# Patient Record
Sex: Female | Born: 1985 | Race: Black or African American | Hispanic: No | Marital: Single | Smoking: Current every day smoker
Health system: Southern US, Community
[De-identification: ages and names within clinical notes are randomized; demographics above are authoritative.]

## PROBLEM LIST (undated history)

## (undated) DIAGNOSIS — Z789 Other specified health status: Secondary | ICD-10-CM

## (undated) DIAGNOSIS — D649 Anemia, unspecified: Secondary | ICD-10-CM

## (undated) HISTORY — PX: NO PAST SURGERIES: SHX2092

---

## 2008-10-08 ENCOUNTER — Emergency Department (HOSPITAL_BASED_OUTPATIENT_CLINIC_OR_DEPARTMENT_OTHER): Admission: EM | Admit: 2008-10-08 | Discharge: 2008-10-08 | Payer: Self-pay | Admitting: Emergency Medicine

## 2008-11-23 ENCOUNTER — Ambulatory Visit: Payer: Self-pay | Admitting: Radiology

## 2008-11-23 ENCOUNTER — Emergency Department (HOSPITAL_BASED_OUTPATIENT_CLINIC_OR_DEPARTMENT_OTHER): Admission: EM | Admit: 2008-11-23 | Discharge: 2008-11-23 | Payer: Self-pay | Admitting: Emergency Medicine

## 2008-11-30 ENCOUNTER — Emergency Department (HOSPITAL_BASED_OUTPATIENT_CLINIC_OR_DEPARTMENT_OTHER): Admission: EM | Admit: 2008-11-30 | Discharge: 2008-11-30 | Payer: Self-pay | Admitting: Emergency Medicine

## 2009-01-12 ENCOUNTER — Emergency Department (HOSPITAL_BASED_OUTPATIENT_CLINIC_OR_DEPARTMENT_OTHER): Admission: EM | Admit: 2009-01-12 | Discharge: 2009-01-12 | Payer: Self-pay | Admitting: Emergency Medicine

## 2009-03-02 ENCOUNTER — Emergency Department (HOSPITAL_BASED_OUTPATIENT_CLINIC_OR_DEPARTMENT_OTHER): Admission: EM | Admit: 2009-03-02 | Discharge: 2009-03-02 | Payer: Self-pay | Admitting: Emergency Medicine

## 2009-03-02 ENCOUNTER — Ambulatory Visit: Payer: Self-pay | Admitting: Diagnostic Radiology

## 2009-03-09 ENCOUNTER — Ambulatory Visit (HOSPITAL_COMMUNITY): Admission: RE | Admit: 2009-03-09 | Discharge: 2009-03-09 | Payer: Self-pay | Admitting: Obstetrics and Gynecology

## 2009-03-23 ENCOUNTER — Ambulatory Visit: Payer: Self-pay | Admitting: Physician Assistant

## 2009-03-23 ENCOUNTER — Inpatient Hospital Stay (HOSPITAL_COMMUNITY): Admission: AD | Admit: 2009-03-23 | Discharge: 2009-03-24 | Payer: Self-pay | Admitting: Obstetrics and Gynecology

## 2009-04-20 ENCOUNTER — Ambulatory Visit (HOSPITAL_COMMUNITY): Admission: RE | Admit: 2009-04-20 | Discharge: 2009-04-20 | Payer: Self-pay | Admitting: Obstetrics and Gynecology

## 2009-04-29 ENCOUNTER — Emergency Department (HOSPITAL_BASED_OUTPATIENT_CLINIC_OR_DEPARTMENT_OTHER): Admission: EM | Admit: 2009-04-29 | Discharge: 2009-04-29 | Payer: Self-pay | Admitting: Emergency Medicine

## 2009-05-01 ENCOUNTER — Emergency Department (HOSPITAL_BASED_OUTPATIENT_CLINIC_OR_DEPARTMENT_OTHER): Admission: EM | Admit: 2009-05-01 | Discharge: 2009-05-02 | Payer: Self-pay | Admitting: Emergency Medicine

## 2009-05-04 ENCOUNTER — Inpatient Hospital Stay (HOSPITAL_COMMUNITY): Admission: AD | Admit: 2009-05-04 | Discharge: 2009-05-04 | Payer: Self-pay | Admitting: Obstetrics and Gynecology

## 2009-07-10 ENCOUNTER — Inpatient Hospital Stay (HOSPITAL_COMMUNITY): Admission: AD | Admit: 2009-07-10 | Discharge: 2009-07-10 | Payer: Self-pay | Admitting: Obstetrics and Gynecology

## 2009-07-17 ENCOUNTER — Inpatient Hospital Stay (HOSPITAL_COMMUNITY): Admission: AD | Admit: 2009-07-17 | Discharge: 2009-07-17 | Payer: Self-pay | Admitting: Obstetrics and Gynecology

## 2009-07-18 ENCOUNTER — Inpatient Hospital Stay (HOSPITAL_COMMUNITY): Admission: AD | Admit: 2009-07-18 | Discharge: 2009-07-20 | Payer: Self-pay | Admitting: Obstetrics and Gynecology

## 2009-11-03 ENCOUNTER — Emergency Department (HOSPITAL_BASED_OUTPATIENT_CLINIC_OR_DEPARTMENT_OTHER): Admission: EM | Admit: 2009-11-03 | Discharge: 2009-11-03 | Payer: Self-pay | Admitting: Emergency Medicine

## 2009-12-21 ENCOUNTER — Emergency Department (HOSPITAL_BASED_OUTPATIENT_CLINIC_OR_DEPARTMENT_OTHER): Admission: EM | Admit: 2009-12-21 | Discharge: 2009-12-21 | Payer: Self-pay | Admitting: Emergency Medicine

## 2009-12-22 ENCOUNTER — Emergency Department (HOSPITAL_BASED_OUTPATIENT_CLINIC_OR_DEPARTMENT_OTHER): Admission: EM | Admit: 2009-12-22 | Discharge: 2009-12-22 | Payer: Self-pay | Admitting: Emergency Medicine

## 2010-01-26 ENCOUNTER — Emergency Department (HOSPITAL_BASED_OUTPATIENT_CLINIC_OR_DEPARTMENT_OTHER): Admission: EM | Admit: 2010-01-26 | Discharge: 2010-01-26 | Payer: Self-pay | Admitting: Emergency Medicine

## 2010-04-01 ENCOUNTER — Emergency Department (HOSPITAL_BASED_OUTPATIENT_CLINIC_OR_DEPARTMENT_OTHER): Admission: EM | Admit: 2010-04-01 | Discharge: 2010-04-01 | Payer: Self-pay | Admitting: Emergency Medicine

## 2010-04-13 ENCOUNTER — Emergency Department (HOSPITAL_BASED_OUTPATIENT_CLINIC_OR_DEPARTMENT_OTHER): Admission: EM | Admit: 2010-04-13 | Discharge: 2010-04-14 | Payer: Self-pay | Admitting: Emergency Medicine

## 2010-04-13 ENCOUNTER — Ambulatory Visit: Payer: Self-pay | Admitting: Diagnostic Radiology

## 2010-06-13 ENCOUNTER — Ambulatory Visit: Payer: Self-pay | Admitting: Radiology

## 2010-06-13 ENCOUNTER — Emergency Department (HOSPITAL_BASED_OUTPATIENT_CLINIC_OR_DEPARTMENT_OTHER): Admission: EM | Admit: 2010-06-13 | Discharge: 2010-06-13 | Payer: Self-pay | Admitting: Emergency Medicine

## 2010-09-11 ENCOUNTER — Emergency Department (HOSPITAL_BASED_OUTPATIENT_CLINIC_OR_DEPARTMENT_OTHER): Admission: EM | Admit: 2010-09-11 | Discharge: 2010-09-11 | Payer: Self-pay | Admitting: Emergency Medicine

## 2010-09-25 ENCOUNTER — Emergency Department (HOSPITAL_BASED_OUTPATIENT_CLINIC_OR_DEPARTMENT_OTHER): Admission: EM | Admit: 2010-09-25 | Discharge: 2010-09-25 | Payer: Self-pay | Admitting: Emergency Medicine

## 2011-02-01 LAB — URINALYSIS, ROUTINE W REFLEX MICROSCOPIC
Bilirubin Urine: NEGATIVE
Glucose, UA: 100 mg/dL — AB
Ketones, ur: 15 mg/dL — AB
Nitrite: NEGATIVE
Protein, ur: NEGATIVE mg/dL
Specific Gravity, Urine: 1.025 (ref 1.005–1.030)
pH: 6.5 (ref 5.0–8.0)

## 2011-02-01 LAB — CBC
Hemoglobin: 11.1 g/dL — ABNORMAL LOW (ref 12.0–15.0)
MCH: 26.2 pg (ref 26.0–34.0)
MCV: 81.2 fL (ref 78.0–100.0)
RBC: 4.26 MIL/uL (ref 3.87–5.11)

## 2011-02-01 LAB — BASIC METABOLIC PANEL
CO2: 27 mEq/L (ref 19–32)
Creatinine, Ser: 0.6 mg/dL (ref 0.4–1.2)
GFR calc Af Amer: >60 mL/min (ref >60)
Glucose, Bld: 98 mg/dL (ref 70–99)
Sodium: 142 mEq/L (ref 135–145)

## 2011-02-01 LAB — URINE MICROSCOPIC-ADD ON

## 2011-02-01 LAB — DIFFERENTIAL
Basophils Absolute: 0 10*3/uL (ref 0.0–0.1)
Basophils Relative: 0 % (ref 0–1)
Monocytes Absolute: 0.8 10*3/uL (ref 0.1–1.0)
Monocytes Relative: 10 % (ref 3–12)

## 2011-02-01 LAB — URINE CULTURE

## 2011-02-01 LAB — HCG, QUANTITATIVE, PREGNANCY: hCG, Beta Chain, Quant, S: 102947 m[IU]/mL — ABNORMAL HIGH (ref <5)

## 2011-02-04 LAB — URINE MICROSCOPIC-ADD ON

## 2011-02-04 LAB — DIFFERENTIAL
Basophils Absolute: 0.1 10*3/uL (ref 0.0–0.1)
Basophils Relative: 1 % (ref 0–1)
Eosinophils Relative: 0 % (ref 0–5)
Lymphocytes Relative: 43 % (ref 12–46)
Monocytes Absolute: 0.6 10*3/uL (ref 0.1–1.0)
Monocytes Relative: 9 % (ref 3–12)
Neutro Abs: 2.8 10*3/uL (ref 1.7–7.7)

## 2011-02-04 LAB — BASIC METABOLIC PANEL
CO2: 27 mEq/L (ref 19–32)
Chloride: 106 mEq/L (ref 96–112)
Sodium: 144 mEq/L (ref 135–145)

## 2011-02-04 LAB — URINALYSIS, ROUTINE W REFLEX MICROSCOPIC
Bilirubin Urine: NEGATIVE
Nitrite: NEGATIVE
Specific Gravity, Urine: 1.024 (ref 1.005–1.030)
Urobilinogen, UA: 0.2 mg/dL (ref 0.0–1.0)

## 2011-02-04 LAB — CBC
Hemoglobin: 12 g/dL (ref 12.0–15.0)
MCV: 82.9 fL (ref 78.0–100.0)
Platelets: 267 10*3/uL (ref 150–400)
RBC: 4.52 MIL/uL (ref 3.87–5.11)

## 2011-02-04 LAB — PREGNANCY, URINE: Preg Test, Ur: NEGATIVE

## 2011-02-25 LAB — CBC
HCT: 18 % — ABNORMAL LOW (ref 36.0–46.0)
Hemoglobin: 4.9 g/dL — CL (ref 12.0–15.0)
Hemoglobin: 5.6 g/dL — CL (ref 12.0–15.0)
Hemoglobin: 8.8 g/dL — ABNORMAL LOW (ref 12.0–15.0)
MCHC: 30.4 g/dL (ref 30.0–36.0)
MCV: 68.3 fL — ABNORMAL LOW (ref 78.0–100.0)
Platelets: 215 10*3/uL (ref 150–400)
RBC: 2.36 MIL/uL — ABNORMAL LOW (ref 3.87–5.11)
RBC: 2.65 MIL/uL — ABNORMAL LOW (ref 3.87–5.11)

## 2011-02-25 LAB — CROSSMATCH

## 2011-02-25 LAB — DIFFERENTIAL
Basophils Relative: 0 % (ref 0–1)
Eosinophils Absolute: 0.1 10*3/uL (ref 0.0–0.7)
Eosinophils Relative: 1 % (ref 0–5)
Monocytes Absolute: 0.7 10*3/uL (ref 0.1–1.0)
Monocytes Relative: 6 % (ref 3–12)

## 2011-02-25 LAB — HEMOGLOBIN AND HEMATOCRIT, BLOOD
HCT: 29.8 % — ABNORMAL LOW (ref 36.0–46.0)
Hemoglobin: 9.5 g/dL — ABNORMAL LOW (ref 12.0–15.0)

## 2011-02-25 LAB — RPR: RPR Ser Ql: NONREACTIVE

## 2011-02-27 LAB — URINALYSIS, ROUTINE W REFLEX MICROSCOPIC
Bilirubin Urine: NEGATIVE
Ketones, ur: NEGATIVE mg/dL
Nitrite: NEGATIVE
pH: 6 (ref 5.0–8.0)

## 2011-02-27 LAB — URINE CULTURE

## 2011-02-27 LAB — WET PREP, GENITAL: Clue Cells Wet Prep HPF POC: NONE SEEN

## 2011-02-27 LAB — CULTURE, ROUTINE-ABSCESS: Culture: NO GROWTH

## 2011-02-28 LAB — URINALYSIS, ROUTINE W REFLEX MICROSCOPIC
Nitrite: NEGATIVE
Specific Gravity, Urine: <1.005 — ABNORMAL LOW (ref 1.005–1.030)
Urobilinogen, UA: 0.2 mg/dL (ref 0.0–1.0)

## 2011-02-28 LAB — URINE CULTURE: Colony Count: 75000

## 2011-02-28 LAB — URINE MICROSCOPIC-ADD ON

## 2011-03-06 LAB — URINALYSIS, ROUTINE W REFLEX MICROSCOPIC
Bilirubin Urine: NEGATIVE
Glucose, UA: NEGATIVE mg/dL
Ketones, ur: 15 mg/dL — AB
pH: 8 (ref 5.0–8.0)

## 2011-03-06 LAB — URINE MICROSCOPIC-ADD ON

## 2011-03-06 LAB — URINE CULTURE

## 2011-03-06 LAB — HCG, QUANTITATIVE, PREGNANCY: hCG, Beta Chain, Quant, S: 31859 m[IU]/mL — ABNORMAL HIGH (ref <5)

## 2011-03-06 LAB — GC/CHLAMYDIA PROBE AMP, GENITAL: Chlamydia, DNA Probe: POSITIVE — AB

## 2011-03-06 LAB — WET PREP, GENITAL: Yeast Wet Prep HPF POC: NONE SEEN

## 2011-03-07 LAB — URINE MICROSCOPIC-ADD ON

## 2011-03-07 LAB — URINALYSIS, ROUTINE W REFLEX MICROSCOPIC
Bilirubin Urine: NEGATIVE
Glucose, UA: NEGATIVE mg/dL
Hgb urine dipstick: NEGATIVE
Ketones, ur: NEGATIVE mg/dL
Nitrite: NEGATIVE
Protein, ur: NEGATIVE mg/dL
Specific Gravity, Urine: 1.016 (ref 1.005–1.030)
Urobilinogen, UA: 0.2 mg/dL (ref 0.0–1.0)
pH: 7.5 (ref 5.0–8.0)

## 2011-03-07 LAB — WET PREP, GENITAL: Yeast Wet Prep HPF POC: NONE SEEN

## 2011-03-07 LAB — GC/CHLAMYDIA PROBE AMP, GENITAL
Chlamydia, DNA Probe: NEGATIVE
GC Probe Amp, Genital: NEGATIVE

## 2011-04-04 NOTE — Discharge Summary (Signed)
NAMERHEANNE, CORTOPASSI NO.:  [id number]   MEDICAL RECORD NO.:  [id number]           PATIENT TYPE:   LOCATION:                                 FACILITY:   PHYSICIAN:  Arlyce Harman, MD     DATE OF BIRTH:  Oct 21, 1986   DATE OF ADMISSION:  07/18/2009  DATE OF DISCHARGE:  07/20/2009                               DISCHARGE SUMMARY   The patient is a 25 year old female who came in Labor and on July 18, 2009 delivered a healthy viable female infant with Apgars of 8 and 9 and a  weight of 7 pounds 6 ounces.  Postdelivery course has been noted for low  hemoglobin, it did drop to 4.9.  Therefore, the patient was typed and  crossed for 3 units of packed red blood cells and transfused 3 units of  prbcs.  Posttransfusion hemoglobin was 9.5 and the patient was feeling  much better and is asking to go home tonight.  Vital signs are stable  and the patient is ambulating well.  No other pertinent laboratory  diagnosis of significant findings.  Instructions are given to have the  patient to follow up in my office in 6 weeks for postpartum exam.  No  sex for 6 weeks.  Continue prenatal vitamins daily, iron daily, regular  diet, stool softeners as needed, and over-the-counter pain medications  as needed such as Tylenol and Motrin.      Arlyce Harman, MD  Electronically Signed     EG/MEDQ  D:  07/20/2009  T:  07/21/2009  Job:  [id number]

## 2012-01-10 ENCOUNTER — Emergency Department (INDEPENDENT_AMBULATORY_CARE_PROVIDER_SITE_OTHER): Payer: Self-pay

## 2012-01-10 ENCOUNTER — Emergency Department (HOSPITAL_BASED_OUTPATIENT_CLINIC_OR_DEPARTMENT_OTHER)
Admission: EM | Admit: 2012-01-10 | Discharge: 2012-01-10 | Disposition: A | Discharge status: Home Health | Payer: Self-pay | Attending: Emergency Medicine | Admitting: Emergency Medicine

## 2012-01-10 ENCOUNTER — Encounter (HOSPITAL_BASED_OUTPATIENT_CLINIC_OR_DEPARTMENT_OTHER): Payer: Self-pay | Admitting: *Deleted

## 2012-01-10 DIAGNOSIS — B9689 Other specified bacterial agents as the cause of diseases classified elsewhere: Secondary | ICD-10-CM | POA: Insufficient documentation

## 2012-01-10 DIAGNOSIS — N390 Urinary tract infection, site not specified: Secondary | ICD-10-CM

## 2012-01-10 DIAGNOSIS — A599 Trichomoniasis, unspecified: Secondary | ICD-10-CM

## 2012-01-10 DIAGNOSIS — E876 Hypokalemia: Secondary | ICD-10-CM

## 2012-01-10 DIAGNOSIS — O99330 Smoking (tobacco) complicating pregnancy, unspecified trimester: Secondary | ICD-10-CM | POA: Insufficient documentation

## 2012-01-10 DIAGNOSIS — O2390 Unspecified genitourinary tract infection in pregnancy, unspecified trimester: Secondary | ICD-10-CM | POA: Insufficient documentation

## 2012-01-10 DIAGNOSIS — A499 Bacterial infection, unspecified: Secondary | ICD-10-CM | POA: Insufficient documentation

## 2012-01-10 DIAGNOSIS — N760 Acute vaginitis: Secondary | ICD-10-CM

## 2012-01-10 DIAGNOSIS — E878 Other disorders of electrolyte and fluid balance, not elsewhere classified: Secondary | ICD-10-CM | POA: Insufficient documentation

## 2012-01-10 DIAGNOSIS — D649 Anemia, unspecified: Secondary | ICD-10-CM

## 2012-01-10 DIAGNOSIS — R1110 Vomiting, unspecified: Secondary | ICD-10-CM

## 2012-01-10 DIAGNOSIS — O211 Hyperemesis gravidarum with metabolic disturbance: Secondary | ICD-10-CM | POA: Insufficient documentation

## 2012-01-10 DIAGNOSIS — Z331 Pregnant state, incidental: Secondary | ICD-10-CM

## 2012-01-10 DIAGNOSIS — O99019 Anemia complicating pregnancy, unspecified trimester: Secondary | ICD-10-CM

## 2012-01-10 HISTORY — DX: Anemia, unspecified: D64.9

## 2012-01-10 LAB — BASIC METABOLIC PANEL
Chloride: 100 mEq/L (ref 96–112)
Creatinine, Ser: 0.4 mg/dL — ABNORMAL LOW (ref 0.50–1.10)
GFR calc Af Amer: >90 mL/min (ref >90)
Potassium: 2.8 mEq/L — ABNORMAL LOW (ref 3.5–5.1)

## 2012-01-10 LAB — URINE MICROSCOPIC-ADD ON

## 2012-01-10 LAB — HCG, QUANTITATIVE, PREGNANCY: hCG, Beta Chain, Quant, S: 60395 m[IU]/mL — ABNORMAL HIGH (ref <5)

## 2012-01-10 LAB — ABO/RH: ABO/RH(D): B POS

## 2012-01-10 LAB — CBC
MCHC: 32.5 g/dL (ref 30.0–36.0)
RDW: 16.4 % — ABNORMAL HIGH (ref 11.5–15.5)

## 2012-01-10 LAB — DIFFERENTIAL
Basophils Absolute: 0 10*3/uL (ref 0.0–0.1)
Basophils Relative: 0 % (ref 0–1)
Monocytes Absolute: 0.6 10*3/uL (ref 0.1–1.0)
Neutro Abs: 5.6 10*3/uL (ref 1.7–7.7)
Neutrophils Relative %: 69 % (ref 43–77)

## 2012-01-10 LAB — PREGNANCY, URINE: Preg Test, Ur: POSITIVE — AB

## 2012-01-10 LAB — URINALYSIS, ROUTINE W REFLEX MICROSCOPIC
Ketones, ur: >80 mg/dL — AB
Nitrite: NEGATIVE
Protein, ur: NEGATIVE mg/dL
Urobilinogen, UA: 0.2 mg/dL (ref 0.0–1.0)

## 2012-01-10 LAB — WET PREP, GENITAL

## 2012-01-10 MED ORDER — PRENATAL COMPLETE 14-0.4 MG PO TABS: 1.0000 | ORAL_TABLET | Freq: Every day | ORAL | Status: DC

## 2012-01-10 MED ORDER — METRONIDAZOLE 500 MG PO TABS
2000.0000 mg | ORAL_TABLET | Freq: Once | ORAL | Status: AC
  Administered 2012-01-10: 2000 mg via ORAL
  Filled 2012-01-10: qty 4

## 2012-01-10 MED ORDER — SODIUM CHLORIDE 0.9 % IV BOLUS (SEPSIS)
1000.0000 mL | Freq: Once | INTRAVENOUS | Status: AC
  Administered 2012-01-10: 1000 mL via INTRAVENOUS

## 2012-01-10 MED ORDER — NITROFURANTOIN MONOHYD MACRO 100 MG PO CAPS: 100.0000 mg | ORAL_CAPSULE | Freq: Two times a day (BID) | ORAL | Status: AC

## 2012-01-10 MED ORDER — ONDANSETRON HCL 4 MG/2ML IJ SOLN
4.0000 mg | Freq: Once | INTRAMUSCULAR | Status: AC
  Administered 2012-01-10: 4 mg via INTRAVENOUS
  Filled 2012-01-10: qty 2

## 2012-01-10 MED ORDER — POTASSIUM CHLORIDE CRYS ER 20 MEQ PO TBCR
40.0000 meq | EXTENDED_RELEASE_TABLET | Freq: Once | ORAL | Status: AC
  Administered 2012-01-10: 40 meq via ORAL
  Filled 2012-01-10: qty 2

## 2012-01-10 MED ORDER — ONDANSETRON 4 MG PO TBDP
4.0000 mg | ORAL_TABLET | Freq: Once | ORAL | Status: AC
  Administered 2012-01-10: 4 mg via ORAL
  Filled 2012-01-10: qty 1

## 2012-01-10 NOTE — Discharge Instructions (Signed)
ABCs of Pregnancy A Antepartum care is very important. Be sure you see your doctor and get prenatal care as soon as you think you are pregnant. At this time, you will be tested for infection, genetic abnormalities and potential problems with you and the pregnancy. This is the time to discuss diet, exercise, work, medications, labor, pain medication during labor and the possibility of a cesarean delivery. Ask any questions that may concern you. It is important to see your doctor regularly throughout your pregnancy. Avoid exposure to toxic substances and chemicals - such as cleaning solvents, lead and mercury, some insecticides, and paint. Pregnant women should avoid exposure to paint fumes, and fumes that cause you to feel ill, dizzy or faint. When possible, it is a good idea to have a pre-pregnancy consultation with your caregiver to begin some important recommendations your caregiver suggests such as, taking folic acid, exercising, quitting smoking, avoiding alcoholic beverages, etc. B Breastfeeding is the healthiest choice for both you and your baby. It has many nutritional benefits for the baby and health benefits for the mother. It also creates a very tight and loving bond between the baby and mother. Talk to your doctor, your family and friends, and your employer about how you choose to feed your baby and how they can support you in your decision. Not all birth defects can be prevented, but a woman can take actions that may increase her chance of having a healthy baby. Many birth defects happen very early in pregnancy, sometimes before a woman even knows she is pregnant. Birth defects or abnormalities of any child in your or the father's family should be discussed with your caregiver. Get a good support bra as your breast size changes. Wear it especially when you exercise and when nursing.  C Celebrate the news of your pregnancy with the your spouse/father and family. Childbirth classes are helpful to  take for you and the spouse/father because it helps to understand what happens during the pregnancy, labor and delivery. Cesarean delivery should be discussed with your doctor so you are prepared for that possibility. The pros and cons of circumcision if it is a boy, should be discussed with your pediatrician. Cigarette smoking during pregnancy can result in low birth weight babies. It has been associated with infertility, miscarriages, tubal pregnancies, infant death (mortality) and poor health (morbidity) in childhood. Additionally, cigarette smoking may cause long-term learning disabilities. If you smoke, you should try to quit before getting pregnant and not smoke during the pregnancy. Secondary smoke may also harm a mother and her developing baby. It is a good idea to ask people to stop smoking around you during your pregnancy and after the baby is born. Extra calcium is necessary when you are pregnant and is found in your prenatal vitamin, in dairy products, green leafy vegetables and in calcium supplements. D A healthy diet according to your current weight and height, along with vitamins and mineral supplements should be discussed with your caregiver. Domestic abuse or violence should be made known to your doctor right away to get the situation corrected. Drink more water when you exercise to keep hydrated. Discomfort of your back and legs usually develops and progresses from the middle of the second trimester through to delivery of the baby. This is because of the enlarging baby and uterus, which may also affect your balance. Do not take illegal drugs. Illegal drugs can seriously harm the baby and you. Drink extra fluids (water is best) throughout pregnancy to help  your body keep up with the increases in your blood volume. Drink at least 6 to 8 glasses of water, fruit juice, or milk each day. A good way to know you are drinking enough fluid is when your urine looks almost like clear water or is very light  yellow.  E Eat healthy to get the nutrients you and your unborn baby need. Your meals should include the five basic food groups. Exercise (30 minutes of light to moderate exercise a day) is important and encouraged during pregnancy, if there are no medical problems or problems with the pregnancy. Exercise that causes discomfort or dizziness should be stopped and reported to your caregiver. Emotions during pregnancy can change from being ecstatic to depression and should be understood by you, your partner and your family. F Fetal screening with ultrasound, amniocentesis and monitoring during pregnancy and labor is common and sometimes necessary. Take 400 micrograms of folic acid daily both before, when possible, and during the first few months of pregnancy to reduce the risk of birth defects of the brain and spine. All women who could possibly become pregnant should take a vitamin with folic acid, every day. It is also important to eat a healthy diet with fortified foods (enriched grain products, including cereals, rice, breads, and pastas) and foods with natural sources of folate (orange juice, green leafy vegetables, beans, peanuts, broccoli, asparagus, peas, and lentils). The father should be involved with all aspects of the pregnancy including, the prenatal care, childbirth classes, labor, delivery, and postpartum time. Fathers may also have emotional concerns about being a father, financial needs, and raising a family. G Genetic testing should be done appropriately. It is important to know your family and the father's history. If there have been problems with pregnancies or birth defects in your family, report these to your doctor. Also, genetic counselors can talk with you about the information you might need in making decisions about having a family. You can call a major medical center in your area for help in finding a board-certified genetic counselor. Genetic testing and counseling should be done  before pregnancy when possible, especially if there is a history of problems in the mother's or father's family. Certain ethnic backgrounds are more at risk for genetic defects. H Get familiar with the hospital where you will be having your baby. Get to know how long it takes to get there, the labor and delivery area, and the hospital procedures. Be sure your medical insurance is accepted there. Get your home ready for the baby including, clothes, the baby's room (when possible), furniture and car seat. Hand washing is important throughout the day, especially after handling raw meat and poultry, changing the baby's diaper or using the bathroom. This can help prevent the spread of many bacteria and viruses that cause infection. Your hair may become dry and thinner, but will return to normal a few weeks after the baby is born. Heartburn is a common problem that can be treated by taking antacids recommended by your caregiver, eating smaller meals 5 or 6 times a day, not drinking liquids when eating, drinking between meals and raising the head of your bed 2 to 3 inches. I Insurance to cover you, the baby, doctor and hospital should be reviewed so that you will be prepared to pay any costs not covered by your insurance plan. If you do not have medical insurance, there are usually clinics and services available for you in your community. Take 30 milligrams of iron during  your pregnancy as prescribed by your doctor to reduce the risk of low red blood cells (anemia) later in pregnancy. All women of childbearing age should eat a diet rich in iron. J There should be a joint effort for the mother, father and any other children to adapt to the pregnancy financially, emotionally, and psychologically during the pregnancy. Join a support group for moms-to-be. Or, join a class on parenting or childbirth. Have the family participate when possible. K Know your limits. Let your caregiver know if you experience any of the  following:   Pain of any kind.   Strong cramps.   You develop a lot of weight in a short period of time (5 pounds in 3 to 5 days).   Vaginal bleeding, leaking of amniotic fluid.   Headache, vision problems.   Dizziness, fainting, shortness of breath.   Chest pain.   Fever of 102 F (38.9 C) or higher.   Gush of clear fluid from your vagina.   Painful urination.   Domestic violence.   Irregular heartbeat (palpitations).   Rapid beating of the heart (tachycardia).   Constant feeling sick to your stomach (nauseous) and vomiting.   Trouble walking, fluid retention (edema).   Muscle weakness.   If your baby has decreased activity.   Persistent diarrhea.   Abnormal vaginal discharge.   Uterine contractions at 20-minute intervals.   Back pain that travels down your leg.  L Learn and practice that what you eat and drink should be in moderation and healthy for you and your baby. Legal drugs such as alcohol and caffeine are important issues for pregnant women. There is no safe amount of alcohol a woman can drink while pregnant. Fetal alcohol syndrome, a disorder characterized by growth retardation, facial abnormalities, and central nervous system dysfunction, is caused by a woman's use of alcohol during pregnancy. Caffeine, found in tea, coffee, soft drinks and chocolate, should also be limited. Be sure to read labels when trying to cut down on caffeine during pregnancy. More than 200 foods, beverages, and over-the-counter medications contain caffeine and have a high salt content! There are coffees and teas that do not contain caffeine. M Medical conditions such as diabetes, epilepsy, and high blood pressure should be treated and kept under control before pregnancy when possible, but especially during pregnancy. Ask your caregiver about any medications that may need to be changed or adjusted during pregnancy. If you are currently taking any medications, ask your caregiver if it  is safe to take them while you are pregnant or before getting pregnant when possible. Also, be sure to discuss any herbs or vitamins you are taking. They are medicines, too! Discuss with your doctor all medications, prescribed and over-the-counter, that you are taking. During your prenatal visit, discuss the medications your doctor may give you during labor and delivery. N Never be afraid to ask your doctor or caregiver questions about your health, the progress of the pregnancy, family problems, stressful situations, and recommendation for a pediatrician, if you do not have one. It is better to take all precautions and discuss any questions or concerns you may have during your office visits. It is a good idea to write down your questions before you visit the doctor. O Over-the-counter cough and cold remedies may contain alcohol or other ingredients that should be avoided during pregnancy. Ask your caregiver about prescription, herbs or over-the-counter medications that you are taking or may consider taking while pregnant.  P Physical activity during pregnancy can  benefit both you and your baby by lessening discomfort and fatigue, providing a sense of well-being, and increasing the likelihood of early recovery after delivery. Light to moderate exercise during pregnancy strengthens the belly (abdominal) and back muscles. This helps improve posture. Practicing yoga, walking, swimming, and cycling on a stationary bicycle are usually safe exercises for pregnant women. Avoid scuba diving, exercise at high altitudes (over 3000 feet), skiing, horseback riding, contact sports, etc. Always check with your doctor before beginning any kind of exercise, especially during pregnancy and especially if you did not exercise before getting pregnant. Q Queasiness, stomach upset and morning sickness are common during pregnancy. Eating a couple of crackers or dry toast before getting out of bed. Foods that you normally love may  make you feel sick to your stomach. You may need to substitute other nutritious foods. Eating 5 or 6 small meals a day instead of 3 large ones may make you feel better. Do not drink with your meals, drink between meals. Questions that you have should be written down and asked during your prenatal visits. R Read about and make plans to baby-proof your home. There are important tips for making your home a safer environment for your baby. Review the tips and make your home safer for you and your baby. Read food labels regarding calories, salt and fat content in the food. S Saunas, hot tubs, and steam rooms should be avoided while you are pregnant. Excessive high heat may be harmful during your pregnancy. Your caregiver will screen and examine you for sexually transmitted diseases and genetic disorders during your prenatal visits. Learn the signs of labor. Sexual relations while pregnant is safe unless there is a medical or pregnancy problem and your caregiver advises against it. T Traveling long distances should be avoided especially in the third trimester of your pregnancy. If you do have to travel out of state, be sure to take a copy of your medical records and medical insurance plan with you. You should not travel long distances without seeing your doctor first. Most airlines will not allow you to travel after 36 weeks of pregnancy. Toxoplasmosis is an infection caused by a parasite that can seriously harm an unborn baby. Avoid eating undercooked meat and handling cat litter. Be sure to wear gloves when gardening. Tingling of the hands and fingers is not unusual and is due to fluid retention. This will go away after the baby is born. U Womb (uterus) size increases during the first trimester. Your kidneys will begin to function more efficiently. This may cause you to feel the need to urinate more often. You may also leak urine when sneezing, coughing or laughing. This is due to the growing uterus pressing  against your bladder, which lies directly in front of and slightly under the uterus during the first few months of pregnancy. If you experience burning along with frequency of urination or bloody urine, be sure to tell your doctor. The size of your uterus in the third trimester may cause a problem with your balance. It is advisable to maintain good posture and avoid wearing high heels during this time. An ultrasound of your baby may be necessary during your pregnancy and is safe for you and your baby. V Vaccinations are an important concern for pregnant women. Get needed vaccines before pregnancy. Center for Disease Control (http://www.wolf.info/) has clear guidelines for the use of vaccines during pregnancy. Review the list, be sure to discuss it with your doctor. Prenatal vitamins are helpful  and healthy for you and the baby. Do not take extra vitamins except what is recommended. Taking too much of certain vitamins can cause overdose problems. Continuous vomiting should be reported to your caregiver. Varicose veins may appear especially if there is a family history of varicose veins. They should subside after the delivery of the baby. Support hose helps if there is leg discomfort. W Being overweight or underweight during pregnancy may cause problems. Try to get within 15 pounds of your ideal weight before pregnancy. Remember, pregnancy is not a time to be dieting! Do not stop eating or start skipping meals as your weight increases. Both you and your baby need the calories and nutrition you receive from a healthy diet. Be sure to consult with your doctor about your diet. There is a formula and diet plan available depending on whether you are overweight or underweight. Your caregiver or nutritionist can help and advise you if necessary. X Avoid X-rays. If you must have dental work or diagnostic tests, tell your dentist or physician that you are pregnant so that extra care can be taken. X-rays should only be taken when  the risks of not taking them outweigh the risk of taking them. If needed, only the minimum amount of radiation should be used. When X-rays are necessary, protective lead shields should be used to cover areas of the body that are not being X-rayed. Y Your baby loves you. Breastfeeding your baby creates a loving and very close bond between the two of you. Give your baby a healthy environment to live in while you are pregnant. Infants and children require constant care and guidance. Their health and safety should be carefully watched at all times. After the baby is born, rest or take a nap when the baby is sleeping. Z Get your ZZZs. Be sure to get plenty of rest. Resting on your side as often as possible, especially on your left side is advised. It provides the best circulation to your baby and helps reduce swelling. Try taking a nap for 30 to 45 minutes in the afternoon when possible. After the baby is born rest or take a nap when the baby is sleeping. Try elevating your feet for that amount of time when possible. It helps the circulation in your legs and helps reduce swelling.  Most information courtesy of the CDC. Document Released: 11/06/2005 Document Revised: 07/19/2011 Document Reviewed: 07/21/2009 [facility] Patient Information 2012 [place], [place].Bacterial Vaginosis Bacterial vaginosis (BV) is a vaginal infection where the normal balance of bacteria in the vagina is disrupted. The normal balance is then replaced by an overgrowth of certain bacteria. There are several different kinds of bacteria that can cause BV. BV is the most common vaginal infection in women of childbearing age. CAUSES   The cause of BV is not fully understood. BV develops when there is an increase or imbalance of harmful bacteria.   Some activities or behaviors can upset the normal balance of bacteria in the vagina and put women at increased risk including:   Having a new sex partner or multiple sex partners.   Douching.    Using an intrauterine device (IUD) for contraception.   It is not clear what role sexual activity plays in the development of BV. However, women that have never had sexual intercourse are rarely infected with BV.  Women do not get BV from toilet seats, bedding, swimming pools or from touching objects around them.  SYMPTOMS   Grey vaginal discharge.   A fish-like  odor with discharge, especially after sexual intercourse.   Itching or burning of the vagina and vulva.   Burning or pain with urination.   Some women have no signs or symptoms at all.  DIAGNOSIS  Your caregiver must examine the vagina for signs of BV. Your caregiver will perform lab tests and look at the sample of vaginal fluid through a microscope. They will look for bacteria and abnormal cells (clue cells), a pH test higher than 4.5, and a positive amine test all associated with BV.  RISKS AND COMPLICATIONS   Pelvic inflammatory disease (PID).   Infections following gynecology surgery.   Developing HIV.   Developing herpes virus.  TREATMENT  Sometimes BV will clear up without treatment. However, all women with symptoms of BV should be treated to avoid complications, especially if gynecology surgery is planned. Female partners generally do not need to be treated. However, BV may spread between female sex partners so treatment is helpful in preventing a recurrence of BV.   BV may be treated with antibiotics. The antibiotics come in either pill or vaginal cream forms. Either can be used with nonpregnant or pregnant women, but the recommended dosages differ. These antibiotics are not harmful to the baby.   BV can recur after treatment. If this happens, a second round of antibiotics will often be prescribed.   Treatment is important for pregnant women. If not treated, BV can cause a premature delivery, especially for a pregnant woman who had a premature birth in the past. All pregnant women who have symptoms of BV should be  checked and treated.   For chronic reoccurrence of BV, treatment with a type of prescribed gel vaginally twice a week is helpful.  HOME CARE INSTRUCTIONS   Finish all medication as directed by your caregiver.   Do not have sex until treatment is completed.   Tell your sexual partner that you have a vaginal infection. They should see their caregiver and be treated if they have problems, such as a mild rash or itching.   Practice safe sex. Use condoms. Only have 1 sex partner.  PREVENTION  Basic prevention steps can help reduce the risk of upsetting the natural balance of bacteria in the vagina and developing BV:  Do not have sexual intercourse (be abstinent).   Do not douche.   Use all of the medicine prescribed for treatment of BV, even if the signs and symptoms go away.   Tell your sex partner if you have BV. That way, they can be treated, if needed, to prevent reoccurrence.  SEEK MEDICAL CARE IF:   Your symptoms are not improving after 3 days of treatment.   You have increased discharge, pain, or fever.  MAKE SURE YOU:   Understand these instructions.   Will watch your condition.   Will get help right away if you are not doing well or get worse.  FOR MORE INFORMATION  Division of STD Prevention (DSTDP), Centers for Disease Control and Prevention: [employer]Apps.[employer].za American Social Health Association (ASHA): www.ashastd.org  Document Released: 11/06/2005 Document Revised: 07/19/2011 Document Reviewed: 04/29/2009 [facility] Patient Information 2012 [place], [place].Trichomoniasis Trichomoniasis is an infection, caused by the Trichomonas organism, that affects both women and men. In women, the outer female genitalia and the vagina are affected. In men, the penis is mainly affected, but the prostate and other reproductive organs can also be involved. Trichomoniasis is a sexually transmitted disease (STD) and is most often passed to another person through sexual contact. The majority of  people who get trichomoniasis do so from a sexual encounter and are also at risk for other STDs. CAUSES   Sexual intercourse with an infected partner.   It can be present in swimming pools or hot tubs.  SYMPTOMS   Abnormal gray-green frothy vaginal discharge in women.   Vaginal itching and irritation in women.   Itching and irritation of the area outside the vagina in women.   Penile discharge with or without pain in males.   Inflammation of the urethra (urethritis), causing painful urination.   Bleeding after sexual intercourse.  RELATED COMPLICATIONS  Pelvic inflammatory disease.   Infection of the uterus (endometritis).   Infertility.   Tubal (ectopic) pregnancy.   It can be associated with other STDs, including gonorrhea and chlamydia, hepatitis B, and HIV.  COMPLICATIONS DURING PREGNANCY  Early (premature) delivery.   Premature rupture of the membranes (PROM).   Low birth weight.  DIAGNOSIS   Visualization of Trichomonas under the microscope from the vagina discharge.   Ph of the vagina greater than 4.5, tested with a test tape.   Trich Rapid Test.   Culture of the organism, but this is not usually needed.   It may be found on a Pap test.   Having a "strawberry cervix,"which means the cervix looks very red like a strawberry.  TREATMENT   You may be given medication to fight the infection. Inform your caregiver if you could be or are pregnant. Some medications used to treat the infection should not be taken during pregnancy.   Over-the-counter medications or creams to decrease itching or irritation may be recommended.   Your sexual partner will need to be treated if infected.  HOME CARE INSTRUCTIONS   Take all medication prescribed by your caregiver.   Take over-the-counter medication for itching or irritation as directed by your caregiver.   Do not have sexual intercourse while you have the infection.   Do not douche or wear tampons.   Discuss  your infection with your partner, as your partner may have acquired the infection from you. Or, your partner may have been the person who transmitted the infection to you.   Have your sex partner examined and treated if necessary.   Practice safe, informed, and protected sex.   See your caregiver for other STD testing.  SEEK MEDICAL CARE IF:   You still have symptoms after you finish the medication.   You have an oral temperature above 102 F (38.9 C).   You develop belly (abdominal) pain.   You have pain when you urinate.   You have bleeding after sexual intercourse.   You develop a rash.   The medication makes you sick or makes you throw up (vomit).  Document Released: 05/02/2001 Document Revised: 07/19/2011 Document Reviewed: 05/28/2009 [facility] Patient Information 2012 [place], [place].

## 2012-01-10 NOTE — ED Provider Notes (Signed)
History     CSN: [id number]  Arrival date & time 01/10/12  1507   First MD Initiated Contact with Patient 01/10/12 1538      Chief Complaint  Patient presents with  . Emesis    (Consider location/radiation/quality/duration/timing/severity/associated sxs/prior treatment) HPI Comments: Patient presents with 3 days of nausea and 12 hours of vomiting. She states she vomited 4 times last night 4 times again this morning. It is nonbilious and nonbloody. She has diffuse abdominal cramping but no pain. She denies any diarrhea, fever, vaginal bleeding or discharge. She thinks she is on her period now. She denies any sick contacts.  Patient is a 26 y.o. female presenting with vomiting. The history is provided by the patient.  Emesis  This is a new problem. The current episode started more than 2 days ago. The problem occurs 5 to 10 times per day. The problem has not changed since onset.The emesis has an appearance of stomach contents. There has been no fever. Associated symptoms include abdominal pain. Pertinent negatives include no chills, no cough, no diarrhea, no fever and no headaches.    Past Medical History  Diagnosis Date  . Anemia     History reviewed. No pertinent past surgical history.  History reviewed. No pertinent family history.  History  Substance Use Topics  . Smoking status: Current Everyday Smoker -- 0.5 packs/day  . Smokeless tobacco: Not on file  . Alcohol Use: No    OB History    Grav Para Term Preterm Abortions TAB SAB Ect Mult Living                  Review of Systems  Constitutional: Positive for activity change, appetite change and fatigue. Negative for fever and chills.  HENT: Negative for congestion and rhinorrhea.   Respiratory: Negative for cough and shortness of breath.   Cardiovascular: Negative for chest pain.  Gastrointestinal: Positive for nausea, vomiting and abdominal pain. Negative for diarrhea.  Genitourinary: Negative for dysuria,  hematuria, vaginal bleeding and vaginal discharge.  Musculoskeletal: Negative for back pain.  Skin: Negative for rash.  Neurological: Negative for dizziness, weakness and headaches.    Allergies  Review of patient's allergies indicates no known allergies.  Home Medications   Current Outpatient Rx  Name Route Sig Dispense Refill  . IBUPROFEN 200 MG PO TABS Oral Take 400 mg by mouth every 6 (six) hours as needed. Patient used this medication for body aches.    Azzie Roup ACE-ETH ESTRAD-FE 1-20 MG-MCG(24) PO TABS Oral Take 1 tablet by mouth daily.      BP 112/51  Pulse 85  Temp(Src) 98.5 F (36.9 C) (Oral)  Resp 16  Ht 5\' 6"  (1.676 m)  Wt 120 lb (54.432 kg)  BMI 19.37 kg/m2  SpO2 100%  LMP 01/08/2012  Physical Exam  Constitutional: She is oriented to person, place, and time. She appears well-developed and well-nourished. No distress.  HENT:  Head: Normocephalic and atraumatic.  Mouth/Throat: Oropharynx is clear and moist. No oropharyngeal exudate.  Eyes: Conjunctivae are normal. Pupils are equal, round, and reactive to light.  Neck: Normal range of motion. Neck supple.  Cardiovascular: Normal rate, regular rhythm and normal heart sounds.   Pulmonary/Chest: Effort normal and breath sounds normal. No respiratory distress.  Abdominal: Soft. There is no tenderness. There is no rebound and no guarding.  Genitourinary:    There is lesion on the left labia. Cervix exhibits no motion tenderness. Right adnexum displays no mass and no tenderness. Left  adnexum displays no mass and no tenderness. No vaginal discharge found.  Musculoskeletal: Normal range of motion. She exhibits no edema and no tenderness.  Neurological: She is alert and oriented to person, place, and time. No cranial nerve deficit.  Skin: Skin is warm.    ED Course  Procedures (including critical care time)  Labs Reviewed  PREGNANCY, URINE - Abnormal; Notable for the following:    Preg Test, Ur POSITIVE (*)     All other components within normal limits  URINALYSIS, ROUTINE W REFLEX MICROSCOPIC - Abnormal; Notable for the following:    Ketones, ur >80 (*)    Leukocytes, UA TRACE (*)    All other components within normal limits  CBC - Abnormal; Notable for the following:    Hemoglobin 11.0 (*)    HCT 33.8 (*)    MCV 75.6 (*)    MCH 24.6 (*)    RDW 16.4 (*)    All other components within normal limits  BASIC METABOLIC PANEL - Abnormal; Notable for the following:    Sodium 134 (*)    Potassium 2.8 (*)    Creatinine, Ser 0.40 (*)    All other components within normal limits  HCG, QUANTITATIVE, PREGNANCY - Abnormal; Notable for the following:    hCG, Beta Chain, Quant, [place] [address] (*)    All other components within normal limits  WET PREP, GENITAL - Abnormal; Notable for the following:    Trich, Wet Prep MANY (*)    Clue Cells Wet Prep HPF POC MODERATE (*)    WBC, Wet Prep HPF POC MANY (*)    All other components within normal limits  URINE MICROSCOPIC-ADD ON - Abnormal; Notable for the following:    Squamous Epithelial / LPF FEW (*)    Bacteria, UA FEW (*)    All other components within normal limits  DIFFERENTIAL  ABO/RH  GC/CHLAMYDIA PROBE AMP, GENITAL   US Ob Comp Less 14 Wks  01/10/2012  *RADIOLOGY REPORT*  Clinical Data: Emesis.  First trimester pregnancy.  Anemia.  OBSTETRIC <14 WK [place] AND TRANSVAGINAL OB US  Technique: Both transabdominal and transvaginal ultrasound examinations were performed for complete evaluation of the gestation as well as the maternal uterus, adnexal regions, and pelvic cul-de-sac.  Findings: An intrauterine gestational sac is noted with visualized embryo and visualized yolk sac.  Embryonic cardiac activity is present at 124 beats per minute.  Mean sac diameter by my measurements 18.7 mm compatible with 7 weeks 0 days gestation.  The embryo is difficult to measure but measures approximately 5 mm in length.  No subchorionic hemorrhage or free pelvic fluid is observed.   No maternal ovarian abnormality is noted.  IMPRESSION:  1.  Single living intrauterine pregnancy measuring at 7 weeks 0 days by my measurements, with embryonic cardiac activity at 124 beats per minute.  No subchorionic hemorrhage noted.  Original Report Authenticated By: Dellia Cloud, M.D.   US Ob Transvaginal  01/10/2012  *RADIOLOGY REPORT*  Clinical Data: Emesis.  First trimester pregnancy.  Anemia.  OBSTETRIC <14 WK [place] AND TRANSVAGINAL OB US  Technique: Both transabdominal and transvaginal ultrasound examinations were performed for complete evaluation of the gestation as well as the maternal uterus, adnexal regions, and pelvic cul-de-sac.  Findings: An intrauterine gestational sac is noted with visualized embryo and visualized yolk sac.  Embryonic cardiac activity is present at 124 beats per minute.  Mean sac diameter by my measurements 18.7 mm compatible with 7 weeks 0 days gestation.  The embryo is difficult to measure but measures approximately 5 mm in length.  No subchorionic hemorrhage or free pelvic fluid is observed.  No maternal ovarian abnormality is noted.  IMPRESSION:  1.  Single living intrauterine pregnancy measuring at 7 weeks 0 days by my measurements, with embryonic cardiac activity at 124 beats per minute.  No subchorionic hemorrhage noted.  Original Report Authenticated By: Dellia Cloud, M.D.     1. Pregnancy as incidental finding   2. Trichimoniasis   3. Urinary tract infection   4. Bacterial vaginosis   5. Hypokalemia       MDM  Nausea and vomiting with minimal abdominal pain. No diarrhea or fever.  Patient states has had some vaginal bleeding 2 days ago and some spotting since.  Urine pregnancy test positive.  Consider threatened abortion. Will treat for BV and trichamonas.  Metronidazole acceptable for use in first trimester. Hypokalemia from vomiting, will replace.  No vomiting in ED. Rh+.  Not Rhogam candidate.  IUP confirmed on [place].   Follow up with  [facility] clinic.       Glynn Octave, MD 01/10/12 [phone]

## 2012-01-10 NOTE — ED Notes (Signed)
Pt c/o n/v x 3 days denies abd pain

## 2012-01-10 NOTE — ED Notes (Signed)
Nausea yesterday and she started vomiting last night. No diarrhea.

## 2012-06-16 ENCOUNTER — Emergency Department (HOSPITAL_BASED_OUTPATIENT_CLINIC_OR_DEPARTMENT_OTHER)
Admission: EM | Admit: 2012-06-16 | Discharge: 2012-06-16 | Disposition: A | Discharge status: Home / Self Care | Payer: No Typology Code available for payment source | Attending: Emergency Medicine | Admitting: Emergency Medicine

## 2012-06-16 ENCOUNTER — Encounter (HOSPITAL_BASED_OUTPATIENT_CLINIC_OR_DEPARTMENT_OTHER): Payer: Self-pay | Admitting: *Deleted

## 2012-06-16 DIAGNOSIS — L02419 Cutaneous abscess of limb, unspecified: Secondary | ICD-10-CM | POA: Insufficient documentation

## 2012-06-16 DIAGNOSIS — S139XXA Sprain of joints and ligaments of unspecified parts of neck, initial encounter: Secondary | ICD-10-CM | POA: Insufficient documentation

## 2012-06-16 DIAGNOSIS — F17200 Nicotine dependence, unspecified, uncomplicated: Secondary | ICD-10-CM | POA: Insufficient documentation

## 2012-06-16 DIAGNOSIS — S134XXA Sprain of ligaments of cervical spine, initial encounter: Secondary | ICD-10-CM

## 2012-06-16 DIAGNOSIS — L03119 Cellulitis of unspecified part of limb: Secondary | ICD-10-CM | POA: Insufficient documentation

## 2012-06-16 MED ORDER — IBUPROFEN 800 MG PO TABS
800.0000 mg | ORAL_TABLET | Freq: Once | ORAL | Status: AC
  Administered 2012-06-16: 800 mg via ORAL
  Filled 2012-06-16: qty 1

## 2012-06-16 MED ORDER — SULFAMETHOXAZOLE-TRIMETHOPRIM 800-160 MG PO TABS: 1.0000 | ORAL_TABLET | Freq: Two times a day (BID) | ORAL | Status: AC

## 2012-06-16 MED ORDER — IBUPROFEN 800 MG PO TABS: 800.0000 mg | ORAL_TABLET | Freq: Three times a day (TID) | ORAL | Status: AC
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
## 2012-06-18 NOTE — ED Provider Notes (Signed)
Medical screening examination/treatment/procedure(s) were performed by non-physician practitioner and as supervising physician I was immediately available for consultation/collaboration.  Raeford Razor, MD 06/18/12 1038

## 2012-08-09 ENCOUNTER — Encounter (HOSPITAL_BASED_OUTPATIENT_CLINIC_OR_DEPARTMENT_OTHER): Payer: Self-pay | Admitting: *Deleted

## 2012-08-09 ENCOUNTER — Emergency Department (HOSPITAL_BASED_OUTPATIENT_CLINIC_OR_DEPARTMENT_OTHER)
Admission: EM | Admit: 2012-08-09 | Discharge: 2012-08-09 | Disposition: A | Discharge status: Home / Self Care | Payer: Self-pay | Attending: Emergency Medicine | Admitting: Emergency Medicine

## 2012-08-09 DIAGNOSIS — O99330 Smoking (tobacco) complicating pregnancy, unspecified trimester: Secondary | ICD-10-CM | POA: Insufficient documentation

## 2012-08-09 DIAGNOSIS — R51 Headache: Secondary | ICD-10-CM | POA: Insufficient documentation

## 2012-08-09 DIAGNOSIS — O9989 Other specified diseases and conditions complicating pregnancy, childbirth and the puerperium: Secondary | ICD-10-CM | POA: Insufficient documentation

## 2012-08-09 DIAGNOSIS — O219 Vomiting of pregnancy, unspecified: Secondary | ICD-10-CM

## 2012-08-09 DIAGNOSIS — O210 Mild hyperemesis gravidarum: Secondary | ICD-10-CM | POA: Insufficient documentation

## 2012-08-09 LAB — URINALYSIS, ROUTINE W REFLEX MICROSCOPIC
Bilirubin Urine: NEGATIVE
Hgb urine dipstick: NEGATIVE
Ketones, ur: >80 mg/dL — AB
Protein, ur: NEGATIVE mg/dL
Urobilinogen, UA: 0.2 mg/dL (ref 0.0–1.0)

## 2012-08-09 LAB — URINE MICROSCOPIC-ADD ON

## 2012-08-09 MED ORDER — METOCLOPRAMIDE HCL 10 MG PO TABS: ORAL_TABLET | ORAL | Status: DC

## 2012-08-09 MED ORDER — SODIUM CHLORIDE 0.9 % IV SOLN
Freq: Once | INTRAVENOUS | Status: AC
  Administered 2012-08-09: 16:00:00 via INTRAVENOUS

## 2012-08-09 MED ORDER — METOCLOPRAMIDE HCL 5 MG/ML IJ SOLN
10.0000 mg | Freq: Once | INTRAMUSCULAR | Status: AC
  Administered 2012-08-09: 10 mg via INTRAVENOUS
  Filled 2012-08-09: qty 2

## 2012-08-09 NOTE — ED Notes (Signed)
MD at bedside. 
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
## 2012-08-09 NOTE — ED Notes (Signed)
Pt states positive preg test at home c/o vomiting and h/a x 3 days

## 2013-02-17 ENCOUNTER — Emergency Department (HOSPITAL_BASED_OUTPATIENT_CLINIC_OR_DEPARTMENT_OTHER)
Admission: EM | Admit: 2013-02-17 | Discharge: 2013-02-17 | Disposition: A | Discharge status: Home / Self Care | Payer: Medicaid Other | Attending: Emergency Medicine | Admitting: Emergency Medicine

## 2013-02-17 ENCOUNTER — Encounter (HOSPITAL_BASED_OUTPATIENT_CLINIC_OR_DEPARTMENT_OTHER): Payer: Self-pay | Admitting: *Deleted

## 2013-02-17 DIAGNOSIS — O219 Vomiting of pregnancy, unspecified: Secondary | ICD-10-CM

## 2013-02-17 DIAGNOSIS — R5383 Other fatigue: Secondary | ICD-10-CM | POA: Insufficient documentation

## 2013-02-17 DIAGNOSIS — Z862 Personal history of diseases of the blood and blood-forming organs and certain disorders involving the immune mechanism: Secondary | ICD-10-CM | POA: Insufficient documentation

## 2013-02-17 DIAGNOSIS — R5381 Other malaise: Secondary | ICD-10-CM | POA: Insufficient documentation

## 2013-02-17 DIAGNOSIS — O210 Mild hyperemesis gravidarum: Secondary | ICD-10-CM | POA: Insufficient documentation

## 2013-02-17 DIAGNOSIS — Z87891 Personal history of nicotine dependence: Secondary | ICD-10-CM | POA: Insufficient documentation

## 2013-02-17 DIAGNOSIS — Z3201 Encounter for pregnancy test, result positive: Secondary | ICD-10-CM | POA: Insufficient documentation

## 2013-02-17 DIAGNOSIS — O9989 Other specified diseases and conditions complicating pregnancy, childbirth and the puerperium: Secondary | ICD-10-CM | POA: Insufficient documentation

## 2013-02-17 LAB — BASIC METABOLIC PANEL
Calcium: 8.5 mg/dL (ref 8.4–10.5)
GFR calc non Af Amer: >90 mL/min (ref >90)
Glucose, Bld: 98 mg/dL (ref 70–99)
Sodium: 134 mEq/L — ABNORMAL LOW (ref 135–145)

## 2013-02-17 LAB — URINALYSIS, ROUTINE W REFLEX MICROSCOPIC
Bilirubin Urine: NEGATIVE
Ketones, ur: >80 mg/dL — AB
Nitrite: NEGATIVE
Urobilinogen, UA: 0.2 mg/dL (ref 0.0–1.0)

## 2013-02-17 LAB — CBC WITH DIFFERENTIAL/PLATELET
Basophils Absolute: 0 10*3/uL (ref 0.0–0.1)
Eosinophils Absolute: 0 10*3/uL (ref 0.0–0.7)
Eosinophils Relative: 0 % (ref 0–5)
MCH: 25.9 pg — ABNORMAL LOW (ref 26.0–34.0)
MCHC: 32.9 g/dL (ref 30.0–36.0)
MCV: 78.8 fL (ref 78.0–100.0)
Platelets: 224 10*3/uL (ref 150–400)
RDW: 15.6 % — ABNORMAL HIGH (ref 11.5–15.5)

## 2013-02-17 MED ORDER — ONDANSETRON HCL 4 MG/2ML IJ SOLN
4.0000 mg | Freq: Once | INTRAMUSCULAR | Status: AC
  Administered 2013-02-17: 4 mg via INTRAVENOUS
  Filled 2013-02-17: qty 2

## 2013-02-17 MED ORDER — SODIUM CHLORIDE 0.9 % IV BOLUS (SEPSIS)
1000.0000 mL | Freq: Once | INTRAVENOUS | Status: AC
  Administered 2013-02-17: 1000 mL via INTRAVENOUS

## 2013-02-17 MED ORDER — ONDANSETRON HCL 4 MG PO TABS
4.0000 mg | ORAL_TABLET | Freq: Four times a day (QID) | ORAL | Status: DC
Start: 2013-02-17 — End: 2013-02-27

## 2013-02-17 NOTE — ED Notes (Signed)
Patient ate saltine crackers four minutes ago and has not vomited.

## 2013-02-17 NOTE — ED Notes (Signed)
Patient vomited in response to fluid challenge - liquid.

## 2013-02-17 NOTE — ED Notes (Signed)
Patient ate grahm crackers and then vomited. The grahms were "too sweet."

## 2013-02-17 NOTE — ED Provider Notes (Signed)
History     CSN: [id number]  Arrival date & time 02/17/13  1351   First MD Initiated Contact with Patient 02/17/13 1359      Chief Complaint  Patient presents with  . Emesis    (Consider location/radiation/quality/duration/timing/severity/associated sxs/prior treatment) HPI Comments: 27 y/o female presents to the ED complaining of vomiting on and off x 2 weeks. She has been intermittently able to keep food down "depending on the day". The last time she ate was 2 days ago when she had macaroni, chicken wings and meatballs. Yesterday and today she vomited everything up. States she took a home pregnancy test 2 weeks ago which was positive and has yet to seek care from ob/gyn but does have her first appointment tomorrow at [facility] LMP January 3. This is not her first pregnancy and cannot remember if she vomited with her first. She is beginning to feel weak and fatigued. Denies abdominal pain, vaginal bleeding or discharge, urinary symptoms, constipation, diarrhea, fever or chills.   Patient is a 27 y.o. female presenting with vomiting. The history is provided by the patient.  Emesis Associated symptoms: no abdominal pain, no chills and no diarrhea     Past Medical History  Diagnosis Date  . Anemia     History reviewed. No pertinent past surgical history.  No family history on file.  History  Substance Use Topics  . Smoking status: Former Smoker -- 0.50 packs/day  . Smokeless tobacco: Not on file  . Alcohol Use: Yes    OB History   Grav Para Term Preterm Abortions TAB SAB Ect Mult Living                  Review of Systems  Constitutional: Positive for appetite change and fatigue. Negative for fever and chills.  Respiratory: Negative for shortness of breath.   Cardiovascular: Negative for chest pain.  Gastrointestinal: Positive for nausea and vomiting. Negative for abdominal pain, diarrhea and constipation.  Genitourinary: Negative for dysuria, hematuria, vaginal  bleeding, vaginal discharge and vaginal pain.  Musculoskeletal: Negative for back pain.  Neurological: Positive for weakness.  All other systems reviewed and are negative.    Allergies  Review of patient's allergies indicates no known allergies.  Home Medications   Current Outpatient Rx  Name  Route  Sig  Dispense  Refill  . metoCLOPramide (REGLAN) 10 MG tablet      Take one tablet every 4 hours if needed for nausea.   12 tablet   0     BP 118/65  Pulse 73  Temp(Src) 99.2 F (37.3 C) (Oral)  Resp 18  Wt 112 lb 6 oz (50.973 kg)  BMI 17.09 kg/m2  SpO2 97%  LMP 11/20/2012  Physical Exam  Nursing note and vitals reviewed. Constitutional: She is oriented to person, place, and time. She appears well-developed and well-nourished. No distress.  HENT:  Head: Normocephalic and atraumatic.  Mouth/Throat: Uvula is midline, oropharynx is clear and moist and mucous membranes are normal.  Eyes: Conjunctivae and EOM are normal. Pupils are equal, round, and reactive to light.  Neck: Normal range of motion. Neck supple.  Cardiovascular: Normal rate, regular rhythm, normal heart sounds and intact distal pulses.   Capillary refill < 2 seconds  Pulmonary/Chest: Effort normal and breath sounds normal. No respiratory distress.  Abdominal: Soft. Normal appearance and bowel sounds are normal. She exhibits no distension. There is no tenderness. There is no rigidity, no rebound and no guarding.  Musculoskeletal: Normal range of  motion. She exhibits no edema.  Neurological: She is alert and oriented to person, place, and time.  Skin: Skin is warm and dry. She is not diaphoretic.  Psychiatric: She has a normal mood and affect. Her behavior is normal.    ED Course  Procedures (including critical care time)  Labs Reviewed  URINALYSIS, ROUTINE W REFLEX MICROSCOPIC - Abnormal; Notable for the following:    APPearance CLOUDY (*)    Ketones, ur >80 (*)    All other components within normal  limits  PREGNANCY, URINE - Abnormal; Notable for the following:    Preg Test, Ur POSITIVE (*)    All other components within normal limits  CBC WITH DIFFERENTIAL - Abnormal; Notable for the following:    WBC 10.9 (*)    Hemoglobin 10.4 (*)    HCT 31.6 (*)    MCH 25.9 (*)    RDW 15.6 (*)    Neutro Abs 8.0 (*)    All other components within normal limits  BASIC METABOLIC PANEL - Abnormal; Notable for the following:    Sodium 134 (*)    Potassium 3.4 (*)    All other components within normal limits   No results found.   1. Nausea/vomiting in pregnancy       MDM  27 y/o female with vomiting and pregnancy. She is in NAD. No abdominal pain or tenderness. Will give fluid bolus for rehydration with zofran and re-assess.  3:21 PM Patient states she is starting to feel a little better with fluids and zofran. Requesting ice. Will give ice and PO challenge.  4:30 PM Patient able to tolerate fluids PO, however began to feel nauseated and vomited once after eating crackers. Will give more fluids, zofran and check basic labs including cbc and bmet.  5:49 PM No concern on basic labs. Patient able to tolerate eating saltine crackers without feeling nauseated or vomiting. Still without abdominal pain or tenderness. Stable for discharge. Rx zofran. Return precautions discussed. Patient states understanding of plan and is agreeable.  Trevor Mace, PA-C 02/17/13 1753

## 2013-02-17 NOTE — ED Notes (Signed)
Vomiting. Positive home pregnancy test last week.

## 2013-02-18 NOTE — ED Provider Notes (Signed)
Medical screening examination/treatment/procedure(s) were performed by non-physician practitioner and as supervising physician I was immediately available for consultation/collaboration.   Charles B. Bernette Mayers, MD 02/18/13 1946

## 2013-02-27 ENCOUNTER — Encounter (HOSPITAL_BASED_OUTPATIENT_CLINIC_OR_DEPARTMENT_OTHER): Payer: Self-pay

## 2013-02-27 ENCOUNTER — Emergency Department (HOSPITAL_BASED_OUTPATIENT_CLINIC_OR_DEPARTMENT_OTHER)
Admission: EM | Admit: 2013-02-27 | Discharge: 2013-02-27 | Disposition: A | Discharge status: Home / Self Care | Payer: Medicaid Other | Attending: Emergency Medicine | Admitting: Emergency Medicine

## 2013-02-27 DIAGNOSIS — Z87891 Personal history of nicotine dependence: Secondary | ICD-10-CM | POA: Insufficient documentation

## 2013-02-27 DIAGNOSIS — O2390 Unspecified genitourinary tract infection in pregnancy, unspecified trimester: Secondary | ICD-10-CM | POA: Insufficient documentation

## 2013-02-27 DIAGNOSIS — O219 Vomiting of pregnancy, unspecified: Secondary | ICD-10-CM

## 2013-02-27 DIAGNOSIS — Z862 Personal history of diseases of the blood and blood-forming organs and certain disorders involving the immune mechanism: Secondary | ICD-10-CM | POA: Insufficient documentation

## 2013-02-27 DIAGNOSIS — R5381 Other malaise: Secondary | ICD-10-CM | POA: Insufficient documentation

## 2013-02-27 DIAGNOSIS — O210 Mild hyperemesis gravidarum: Secondary | ICD-10-CM | POA: Insufficient documentation

## 2013-02-27 DIAGNOSIS — E876 Hypokalemia: Secondary | ICD-10-CM | POA: Insufficient documentation

## 2013-02-27 LAB — COMPREHENSIVE METABOLIC PANEL
ALT: 6 U/L (ref 0–35)
AST: 16 U/L (ref 0–37)
Albumin: 4.3 g/dL (ref 3.5–5.2)
Alkaline Phosphatase: 37 U/L — ABNORMAL LOW (ref 39–117)
GFR calc Af Amer: >90 mL/min (ref >90)
Glucose, Bld: 97 mg/dL (ref 70–99)
Potassium: 3.1 mEq/L — ABNORMAL LOW (ref 3.5–5.1)
Sodium: 136 mEq/L (ref 135–145)
Total Protein: 8.9 g/dL — ABNORMAL HIGH (ref 6.0–8.3)

## 2013-02-27 LAB — URINALYSIS, ROUTINE W REFLEX MICROSCOPIC
Bilirubin Urine: NEGATIVE
Glucose, UA: NEGATIVE mg/dL
Hgb urine dipstick: NEGATIVE
Ketones, ur: >80 mg/dL — AB
pH: 7 (ref 5.0–8.0)

## 2013-02-27 LAB — URINE MICROSCOPIC-ADD ON

## 2013-02-27 LAB — CBC WITH DIFFERENTIAL/PLATELET
Basophils Absolute: 0 10*3/uL (ref 0.0–0.1)
Eosinophils Absolute: 0 10*3/uL (ref 0.0–0.7)
Lymphs Abs: 2.4 10*3/uL (ref 0.7–4.0)
MCH: 26 pg (ref 26.0–34.0)
Neutrophils Relative %: 75 % (ref 43–77)
Platelets: 287 10*3/uL (ref 150–400)
RBC: 5.03 MIL/uL (ref 3.87–5.11)
WBC: 12.1 10*3/uL — ABNORMAL HIGH (ref 4.0–10.5)

## 2013-02-27 MED ORDER — ONDANSETRON 4 MG PO TBDP: ORAL_TABLET | ORAL | Status: DC

## 2013-02-27 MED ORDER — SODIUM CHLORIDE 0.9 % IV BOLUS (SEPSIS)
1000.0000 mL | Freq: Once | INTRAVENOUS | Status: AC
  Administered 2013-02-27: 1000 mL via INTRAVENOUS

## 2013-02-27 MED ORDER — POTASSIUM CHLORIDE CRYS ER 20 MEQ PO TBCR
40.0000 meq | EXTENDED_RELEASE_TABLET | Freq: Once | ORAL | Status: AC
  Administered 2013-02-27: 40 meq via ORAL
  Filled 2013-02-27: qty 2

## 2013-02-27 MED ORDER — ONDANSETRON HCL 4 MG/2ML IJ SOLN
4.0000 mg | Freq: Once | INTRAMUSCULAR | Status: AC
  Administered 2013-02-27: 4 mg via INTRAVENOUS
  Filled 2013-02-27: qty 2

## 2013-02-27 NOTE — ED Notes (Signed)
Pt reports she is 2 months pregnant and has been vomiting continuously.

## 2013-02-27 NOTE — ED Notes (Signed)
MD at bedside. 

## 2013-02-27 NOTE — ED Provider Notes (Signed)
History     CSN: [id number]  Arrival date & time 02/27/13  7829   First MD Initiated Contact with Patient 02/27/13 1011      Chief Complaint  Patient presents with  . Emesis During Pregnancy    (Consider location/radiation/quality/duration/timing/severity/associated sxs/prior treatment) HPI Pt is [redacted] week pregnant by LMP. She continue to have persistent vomiting. States that she swallows the zofran but then vomits back up. No fever, chills, diarrhea, abd pain, vag bleeding or d/c, urinary symptoms. +fatigue.  Past Medical History  Diagnosis Date  . Anemia     History reviewed. No pertinent past surgical history.  No family history on file.  History  Substance Use Topics  . Smoking status: Former Smoker -- 0.50 packs/day  . Smokeless tobacco: Not on file  . Alcohol Use: Yes     Comment: occasional    OB History   Grav Para Term Preterm Abortions TAB SAB Ect Mult Living   1               Review of Systems  Constitutional: Positive for fatigue. Negative for fever and chills.  Respiratory: Negative for cough and shortness of breath.   Cardiovascular: Negative for chest pain.  Gastrointestinal: Positive for nausea and vomiting. Negative for abdominal pain, diarrhea, constipation and blood in stool.  Genitourinary: Negative for dysuria, frequency, flank pain, vaginal bleeding and vaginal discharge.  Musculoskeletal: Negative for back pain.  Skin: Negative for rash and wound.  Neurological: Negative for dizziness, weakness, light-headedness, numbness and headaches.  All other systems reviewed and are negative.    Allergies  Review of patient's allergies indicates no known allergies.  Home Medications   Current Outpatient Rx  Name  Route  Sig  Dispense  Refill  . ondansetron (ZOFRAN ODT) 4 MG disintegrating tablet      4mg  ODT q4 hours prn nausea/vomit   8 tablet   0     BP 122/58  Pulse 64  Temp(Src) 98.4 F (36.9 C) (Oral)  Resp 18  Ht 5\' 6"  (1.676 m)   Wt 112 lb (50.803 kg)  BMI 18.09 kg/m2  SpO2 100%  LMP 11/20/2012  Physical Exam  Nursing note and vitals reviewed. Constitutional: She is oriented to person, place, and time. She appears well-developed and well-nourished. No distress.  HENT:  Head: Normocephalic and atraumatic.  Mouth/Throat: Oropharynx is clear and moist.  Eyes: EOM are normal. Pupils are equal, round, and reactive to light.  Neck: Normal range of motion. Neck supple.  Cardiovascular: Normal rate and regular rhythm.   Pulmonary/Chest: Effort normal and breath sounds normal. No respiratory distress. She has no wheezes. She has no rales.  Abdominal: Soft. Bowel sounds are normal. She exhibits no distension and no mass. There is no tenderness. There is no rebound and no guarding.  Musculoskeletal: Normal range of motion. She exhibits no edema and no tenderness.  Neurological: She is alert and oriented to person, place, and time.  Skin: Skin is warm and dry. No rash noted. No erythema.  Skin is dry appearing  Psychiatric: She has a normal mood and affect. Her behavior is normal.    ED Course  Procedures (including critical care time)  Labs Reviewed  CBC WITH DIFFERENTIAL - Abnormal; Notable for the following:    WBC 12.1 (*)    RDW 16.2 (*)    Neutro Abs 9.0 (*)    All other components within normal limits  COMPREHENSIVE METABOLIC PANEL - Abnormal; Notable for the following:  Potassium 3.1 (*)    Total Protein 8.9 (*)    Alkaline Phosphatase 37 (*)    All other components within normal limits  URINALYSIS, ROUTINE W REFLEX MICROSCOPIC - Abnormal; Notable for the following:    APPearance TURBID (*)    Ketones, ur >80 (*)    Leukocytes, UA MODERATE (*)    All other components within normal limits  URINE MICROSCOPIC-ADD ON - Abnormal; Notable for the following:    Squamous Epithelial / LPF FEW (*)    Bacteria, UA FEW (*)    All other components within normal limits  URINE CULTURE   No results  found.   1. Vomiting complicating pregnancy   2. Hypokalemia       MDM   Pt states she is feeling much better. Tolerating PO's. Return precautions given       Loren Racer, MD 02/27/13 1259

## 2013-02-28 LAB — URINE CULTURE

## 2014-09-21 ENCOUNTER — Encounter (HOSPITAL_BASED_OUTPATIENT_CLINIC_OR_DEPARTMENT_OTHER): Payer: Self-pay

## 2016-11-27 ENCOUNTER — Encounter (HOSPITAL_BASED_OUTPATIENT_CLINIC_OR_DEPARTMENT_OTHER): Payer: Self-pay

## 2016-11-27 ENCOUNTER — Emergency Department (HOSPITAL_BASED_OUTPATIENT_CLINIC_OR_DEPARTMENT_OTHER)
Admission: EM | Admit: 2016-11-27 | Discharge: 2016-11-27 | Disposition: A | Discharge status: Home / Self Care | Payer: Medicaid Other | Attending: Emergency Medicine | Admitting: Emergency Medicine

## 2016-11-27 DIAGNOSIS — F17200 Nicotine dependence, unspecified, uncomplicated: Secondary | ICD-10-CM | POA: Insufficient documentation

## 2016-11-27 DIAGNOSIS — Z79899 Other long term (current) drug therapy: Secondary | ICD-10-CM | POA: Insufficient documentation

## 2016-11-27 DIAGNOSIS — K0889 Other specified disorders of teeth and supporting structures: Secondary | ICD-10-CM | POA: Insufficient documentation

## 2016-11-27 MED ORDER — LIDOCAINE VISCOUS 2 % MT SOLN: OROMUCOSAL | 0 refills | Status: DC

## 2016-11-27 MED ORDER — NAPROXEN 500 MG PO TABS: 500.0000 mg | ORAL_TABLET | Freq: Two times a day (BID) | ORAL | 0 refills | Status: DC

## 2016-11-27 MED ORDER — NAPROXEN 250 MG PO TABS
500.0000 mg | ORAL_TABLET | Freq: Once | ORAL | Status: AC
  Administered 2016-11-27: 500 mg via ORAL
  Filled 2016-11-27: qty 2

## 2016-11-27 MED ORDER — PENICILLIN V POTASSIUM 500 MG PO TABS: 500.0000 mg | ORAL_TABLET | Freq: Four times a day (QID) | ORAL | 0 refills | Status: DC

## 2016-11-27 MED ORDER — PENICILLIN V POTASSIUM 250 MG PO TABS
500.0000 mg | ORAL_TABLET | Freq: Once | ORAL | Status: AC
  Administered 2016-11-27: 500 mg via ORAL
  Filled 2016-11-27: qty 2

## 2016-11-27 MED FILL — NAPROXEN 500 MG TABLET: 500 | 15 days supply | Qty: 30 | Fill #0

## 2016-11-27 MED FILL — PENICILLIN VK 500 MG TABLET: 500 | 10 days supply | Qty: 40 | Fill #0

## 2016-11-27 MED FILL — LIDOCAINE 2% VISCOUS SOLN: 2 | 30 days supply | Qty: 100 | Fill #0

## 2016-11-27 NOTE — Discharge Instructions (Signed)
Take the prescribed medication as directed. Follow-up with your dentist as soon as possible. Return to the ED for new or worsening symptoms. 

## 2016-11-27 NOTE — ED Provider Notes (Signed)
[facility]   CSN: [id number] Arrival date & time: 11/27/16  1217     History   Chief Complaint Chief Complaint  Patient presents with  . Dental Pain    HPI Nicole Coleman is a 31 y.o. female.  The history is provided by the patient and medical records.  Dental Pain      31 year old female with history of anemia, presenting to the ED for left lower dental pain. States this began yesterday and has persisted throughout the night. States she got very little sleep due to the pain. She now has some mild swelling of her left lower jaw. She is not having any nausea or vomiting. No difficulty swallowing. No fever or chills. She has dentist in the area, however cannot be seen until 12/06/2016. She tried some Tylenol at home without any relief of her pain.  Past Medical History:  Diagnosis Date  . Anemia     There are no active problems to display for this patient.   History reviewed. No pertinent surgical history.  OB History    Gravida Para Term Preterm AB Living   1             SAB TAB Ectopic Multiple Live Births                   Home Medications    Prior to Admission medications   Medication Sig Start Date End Date Taking? Authorizing Provider  acetaminophen (TYLENOL) 325 MG tablet Take 650 mg by mouth every 6 (six) hours as needed.   Yes Historical Provider, MD    Family History No family history on file.  Social History Social History  Substance Use Topics  . Smoking status: Current Every Day Smoker    Packs/day: 0.50  . Smokeless tobacco: Never Used  . Alcohol use Yes     Comment: occasional     Allergies   Patient has no known allergies.   Review of Systems Review of Systems  HENT: Positive for dental problem.   All other systems reviewed and are negative.    Physical Exam Updated Vital Signs BP 147/97 (BP Location: Right Arm)   Pulse 63   Temp 98.1 F (36.7 C) (Oral)   Resp 18   Ht 5\' 6"  (1.676 m)   Wt 69.7  kg   LMP 11/23/2016   SpO2 100%   Breastfeeding? Unknown   BMI 24.81 kg/m   Physical Exam  Constitutional: She is oriented to person, place, and time. She appears well-developed and well-nourished.  HENT:  Head: Normocephalic and atraumatic.  Mouth/Throat: Oropharynx is clear and moist.  Teeth largely in poor dentition, left lower molar is broken with large cavity noted, surrounding gingiva slightly swollen with some extension into left inner cheek, handling secretions appropriately, no trismus, no external facial or neck swelling, normal phonation without stridor  Eyes: Conjunctivae and EOM are normal. Pupils are equal, round, and reactive to light.  Neck: Normal range of motion.  Cardiovascular: Normal rate, regular rhythm and normal heart sounds.   Pulmonary/Chest: Effort normal and breath sounds normal. No respiratory distress. She has no wheezes.  Abdominal: Soft. Bowel sounds are normal. There is no tenderness. There is no rebound.  Musculoskeletal: Normal range of motion.  Neurological: She is alert and oriented to person, place, and time.  Skin: Skin is warm and dry.  Psychiatric: She has a normal mood and affect.  Nursing note and vitals reviewed.  ED Treatments / Results  Labs (all labs ordered are listed, but only abnormal results are displayed) Labs Reviewed - No data to display  EKG  EKG Interpretation None       Radiology No results found.  Procedures Procedures (including critical care time)  Medications Ordered in ED Medications  penicillin v potassium (VEETID) tablet 500 mg (500 mg Oral Given 11/27/16 1257)  naproxen (NAPROSYN) tablet 500 mg (500 mg Oral Given 11/27/16 1257)     Initial Impression / Assessment and Plan / ED Course  I have reviewed the triage vital signs and the nursing notes.  Pertinent labs & imaging results that were available during my care of the patient were reviewed by me and considered in my medical decision making (see  chart for details).  Clinical Course    31 year old female here with left lower dental pain. On exam she has poor dentition and likely developing abscess at the left lower molar. There is no neck swelling. She is handling secretions well, normal phonation without stridor. Not clinically concerning for Ludwig's angina. Patient will be started on antibiotics and given Naprosyn for pain. She has scheduled follow-up with her dentist on 12/06/2016.  Discussed plan with patient, she acknowledged understanding and agreed with plan of care.  Return precautions given for new or worsening symptoms.  Final Clinical Impressions(s) / ED Diagnoses   Final diagnoses:  Pain, dental    New Prescriptions Discharge Medication List as of 11/27/2016  1:26 PM    START taking these medications   Details  lidocaine (XYLOCAINE) 2 % solution Apply topically to affected tooth as needed for pain., Print    naproxen (NAPROSYN) 500 MG tablet Take 1 tablet (500 mg total) by mouth 2 (two) times daily with a meal., Starting Mon 11/27/2016, Print    penicillin v potassium (VEETID) 500 MG tablet Take 1 tablet (500 mg total) by mouth 4 (four) times daily., Starting Mon 11/27/2016, Print         Garlon HatchetLisa M Sanders, PA-C 11/27/16 1356    Benjiman CoreNathan Pickering, MD 11/27/16 1505

## 2016-11-27 NOTE — ED Triage Notes (Addendum)
C/o left lower toothache with swelling noted-started last night-pt thinks a filling fell out but unsure when-NAD-steady gait

## 2017-05-19 ENCOUNTER — Emergency Department (HOSPITAL_BASED_OUTPATIENT_CLINIC_OR_DEPARTMENT_OTHER)
Admission: EM | Admit: 2017-05-19 | Discharge: 2017-05-19 | Disposition: A | Discharge status: Home / Self Care | Payer: Self-pay | Attending: Emergency Medicine | Admitting: Emergency Medicine

## 2017-05-19 ENCOUNTER — Encounter (HOSPITAL_BASED_OUTPATIENT_CLINIC_OR_DEPARTMENT_OTHER): Payer: Self-pay | Admitting: Emergency Medicine

## 2017-05-19 ENCOUNTER — Emergency Department (HOSPITAL_BASED_OUTPATIENT_CLINIC_OR_DEPARTMENT_OTHER): Payer: Self-pay

## 2017-05-19 DIAGNOSIS — Y929 Unspecified place or not applicable: Secondary | ICD-10-CM | POA: Insufficient documentation

## 2017-05-19 DIAGNOSIS — R55 Syncope and collapse: Secondary | ICD-10-CM | POA: Insufficient documentation

## 2017-05-19 DIAGNOSIS — Y999 Unspecified external cause status: Secondary | ICD-10-CM | POA: Insufficient documentation

## 2017-05-19 DIAGNOSIS — W19XXXA Unspecified fall, initial encounter: Secondary | ICD-10-CM | POA: Insufficient documentation

## 2017-05-19 DIAGNOSIS — Z79899 Other long term (current) drug therapy: Secondary | ICD-10-CM | POA: Insufficient documentation

## 2017-05-19 DIAGNOSIS — Z87891 Personal history of nicotine dependence: Secondary | ICD-10-CM | POA: Insufficient documentation

## 2017-05-19 DIAGNOSIS — M25562 Pain in left knee: Secondary | ICD-10-CM | POA: Insufficient documentation

## 2017-05-19 DIAGNOSIS — Y9301 Activity, walking, marching and hiking: Secondary | ICD-10-CM | POA: Insufficient documentation

## 2017-05-19 LAB — CBC
HEMATOCRIT: 36.7 % (ref 36.0–46.0)
Hemoglobin: 11.6 g/dL — ABNORMAL LOW (ref 12.0–15.0)
MCH: 23 pg — ABNORMAL LOW (ref 26.0–34.0)
MCHC: 31.6 g/dL (ref 30.0–36.0)
MCV: 72.8 fL — AB (ref 78.0–100.0)
Platelets: 181 10*3/uL (ref 150–400)
RBC: 5.04 MIL/uL (ref 3.87–5.11)
RDW: 17.7 % — ABNORMAL HIGH (ref 11.5–15.5)
WBC: 10.6 10*3/uL — AB (ref 4.0–10.5)

## 2017-05-19 LAB — BASIC METABOLIC PANEL
ANION GAP: 8 (ref 5–15)
BUN: 11 mg/dL (ref 6–20)
CHLORIDE: 105 mmol/L (ref 101–111)
CO2: 23 mmol/L (ref 22–32)
Calcium: 9 mg/dL (ref 8.9–10.3)
Creatinine, Ser: 0.68 mg/dL (ref 0.44–1.00)
GFR calc Af Amer: >60 mL/min (ref >60)
GFR calc non Af Amer: >60 mL/min (ref >60)
GLUCOSE: 103 mg/dL — AB (ref 65–99)
POTASSIUM: 3.9 mmol/L (ref 3.5–5.1)
Sodium: 136 mmol/L (ref 135–145)

## 2017-05-19 MED ORDER — SODIUM CHLORIDE 0.9 % IV BOLUS (SEPSIS)
1000.0000 mL | Freq: Once | INTRAVENOUS | Status: AC
  Administered 2017-05-19: 1000 mL via INTRAVENOUS

## 2017-05-19 MED ORDER — IBUPROFEN 600 MG PO TABS: 600.0000 mg | ORAL_TABLET | Freq: Four times a day (QID) | ORAL | 0 refills | Status: DC | PRN

## 2017-05-19 MED ORDER — OXYCODONE-ACETAMINOPHEN 5-325 MG PO TABS
1.0000 | ORAL_TABLET | Freq: Once | ORAL | Status: AC
  Administered 2017-05-19: 1 via ORAL
  Filled 2017-05-19: qty 1

## 2017-05-19 MED ORDER — SODIUM CHLORIDE 0.9 % IV BOLUS (SEPSIS): 1000.0000 mL | Freq: Once | INTRAVENOUS | Status: DC

## 2017-05-19 NOTE — ED Triage Notes (Signed)
Pt reports feeling lightheaded and passing out twice yesterday in close succession.  States each time LOC, friend witnessed it, and he told her that after first episode, she got up, tried to walk and then went down again.  Pt does not remember events but reports left knee pain today from the fall yesterday.  Pt h/o anemia.

## 2017-05-19 NOTE — ED Notes (Signed)
Patient transported to CT 

## 2017-05-19 NOTE — ED Provider Notes (Signed)
[facility]   CSN: [id number] Arrival date & time: 05/19/17  1252     History   Chief Complaint Chief Complaint  Patient presents with  . Loss of Consciousness    HPI Nicole Coleman is a 31 y.o. female.  HPI  31 y.o. female with a hx of Anemia, presents to the Emergency Department today due to syncopal episode x 2 last night around 0145. Noted hx same. Pt states that she was walking with her friend when she "passed out." This was witnessed. Pt was caught by friends. Denies headache, CP/SOB prior to episode. None post episode. No seizure like activity. Duration 5-10 seconds. Only pain is to left knee due to falling on it. No numbness/tingling. No fevers. No cardiac hx. No medical history other than anemia where she had low Hgb that caused syncope. Does not take iron supplements. No other symptoms noted.    Past Medical History:  Diagnosis Date  . Anemia     There are no active problems to display for this patient.   History reviewed. No pertinent surgical history.  OB History    Gravida Para Term Preterm AB Living   1             SAB TAB Ectopic Multiple Live Births                   Home Medications    Prior to Admission medications   Medication Sig Start Date End Date Taking? Authorizing Provider  acetaminophen (TYLENOL) 325 MG tablet Take 650 mg by mouth every 6 (six) hours as needed.    [provider]  lidocaine (XYLOCAINE) 2 % solution Apply topically to affected tooth as needed for pain. 11/27/16   Garlon HatchetSanders, Lisa M, PA-C  naproxen (NAPROSYN) 500 MG tablet Take 1 tablet (500 mg total) by mouth 2 (two) times daily with a meal. 11/27/16   Garlon HatchetSanders, Lisa M, PA-C  penicillin v potassium (VEETID) 500 MG tablet Take 1 tablet (500 mg total) by mouth 4 (four) times daily. 11/27/16   Garlon HatchetSanders, Lisa M, PA-C    Family History No family history on file.  Social History Social History  Substance Use Topics  . Smoking status: Current Every Day  Smoker    Packs/day: 0.50  . Smokeless tobacco: Never Used  . Alcohol use Yes     Comment: occasional     Allergies   Patient has no known allergies.   Review of Systems Review of Systems ROS reviewed and all are negative for acute change except as noted in the HPI.  Physical Exam Updated Vital Signs BP 128/70 (BP Location: Left Arm)   Pulse 94   Temp 98.4 F (36.9 C) (Oral)   Resp 18   Ht 5\' 5"  (1.651 m)   Wt 69.4 kg (153 lb)   LMP 05/09/2017 (Approximate)   SpO2 100%   BMI 25.46 kg/m   Physical Exam  Constitutional: She is oriented to person, place, and time. Vital signs are normal. She appears well-developed and well-nourished.  HENT:  Head: Normocephalic and atraumatic.  Right Ear: Hearing normal.  Left Ear: Hearing normal.  Eyes: Conjunctivae and EOM are normal. Pupils are equal, round, and reactive to light.  Neck: Normal range of motion. Neck supple.  Cardiovascular: Normal rate, regular rhythm, normal heart sounds and intact distal pulses.   Pulmonary/Chest: Effort normal and breath sounds normal.  Abdominal: Soft. There is no tenderness.  Musculoskeletal: Normal range of motion.  Left Knee: TTP along anterior aspect. Limited ROM due to pain. Mild swelling. No erythema. NVI. Distal pulses appreciated.   Neurological: She is alert and oriented to person, place, and time.  Skin: Skin is warm and dry.  Psychiatric: She has a normal mood and affect. Her speech is normal and behavior is normal. Thought content normal.  Nursing note and vitals reviewed.  ED Treatments / Results  Labs (all labs ordered are listed, but only abnormal results are displayed) Labs Reviewed  CBC - Abnormal; Notable for the following:       Result Value   WBC 10.6 (*)    Hemoglobin 11.6 (*)    MCV 72.8 (*)    MCH 23.0 (*)    RDW 17.7 (*)    All other components within normal limits  BASIC METABOLIC PANEL - Abnormal; Notable for the following:    Glucose, Bld 103 (*)    All  other components within normal limits   EKG  EKG Interpretation  Date/Time:  Saturday May 19 2017 14:54:14 EDT Ventricular Rate:  75 PR Interval:    QRS Duration: 94 QT Interval:  358 QTC Calculation: 400 R Axis:   81 Text Interpretation:  Sinus rhythm Borderline Q waves in lateral leads Baseline wander in lead(s) II III aVR aVF no significant change since 2011 Confirmed by Pricilla Loveless [phone]) on 05/19/2017 3:36:50 PM       Radiology Ct Head Wo Contrast  Result Date: 05/19/2017 CLINICAL DATA:  Lightheaded, syncopal episodes twice today in close succession witnessed by a friend, history of smoking EXAM: CT HEAD WITHOUT CONTRAST TECHNIQUE: Contiguous axial images were obtained from the base of the skull through the vertex without intravenous contrast. Sagittal and coronal MPR images reconstructed from axial data set. COMPARISON:  None FINDINGS: Brain: Normal ventricular morphology. No midline shift or mass effect. Normal appearance of brain parenchyma. No intracranial hemorrhage, mass lesion or evidence of acute infarction. No extra-axial fluid collections. Vascular: Unremarkable Skull: Intact Sinuses/Orbits: Clear Other: N/A IMPRESSION: Normal exam. Electronically Signed   By: Ulyses Southward M.D.   On: 05/19/2017 15:33   Dg Knee Complete 4 Views Left  Result Date: 05/19/2017 CLINICAL DATA:  Passed out twice yesterday, LEFT knee pain due to falling yesterday, some swelling EXAM: LEFT KNEE - COMPLETE 4+ VIEW COMPARISON:  None FINDINGS: Osseous mineralization normal. Mild medial compartment joint space narrowing. Joint spaces otherwise preserved. No acute fracture, dislocation, or bone destruction. No knee joint effusion. IMPRESSION: No acute osseous abnormalities. Electronically Signed   By: Ulyses Southward M.D.   On: 05/19/2017 16:40    Procedures Procedures (including critical care time)  Medications Ordered in ED Medications  sodium chloride 0.9 % bolus 1,000 mL (not administered)    oxyCODONE-acetaminophen (PERCOCET/ROXICET) 5-325 MG per tablet 1 tablet (not administered)   Initial Impression / Assessment and Plan / ED Course  I have reviewed the triage vital signs and the nursing notes.  Pertinent labs & imaging results that were available during my care of the patient were reviewed by me and considered in my medical decision making (see chart for details).  Final Clinical Impressions(s) / ED Diagnoses  {I have reviewed and evaluated the relevant laboratory values.  {I have interpreted the relevant EKG. {I have reviewed the relevant previous healthcare records.  {I obtained HPI from historian.   ED Course:  Assessment: Pt is a 31 y.o. female with a hx of Anemia, presents to the Emergency Department today due to syncopal episode  x 2 last night around 0145. Noted hx same. Pt states that she was walking with her friend when she "passed out." This was witnessed. Pt was caught by friends. Denies headache, CP/SOB prior to episode. None post episode. No seizure like activity. Duration 5-10 seconds. Only pain is to left knee due to falling on it. No numbness/tingling. No fevers. No cardiac hx. No medical history other than anemia where she had low Hgb that caused syncope. Does not take iron supplements.. On exam, pt in NAD. Nontoxic/nonseptic appearing. VSS. Afebrile. Lungs CTA. Heart RRR. Abdomen nontender soft. CN evaluated and unremarkable. Labs unremarkable. Hgb stable. CT head unremarkable. EKG unremarkable. Given NS bolus in ED. Suspect orthostatic etiology. Pt drinking last night and presumably dehydrated. Orthostatics obtained here and unremarkable. DG left knee unremarkable. Likely contusion. Given brace. Plan is to DC home with follow up to PCP. At time of discharge, Patient is in no acute distress. Vital Signs are stable. Patient is able to ambulate. Patient able to tolerate PO.   Disposition/Plan:  DC Home Additional Verbal discharge instructions given and discussed with  patient.  Pt Instructed to f/u with PCP in the next week for evaluation and treatment of symptoms. Return precautions given Pt acknowledges and agrees with plan  Supervising Physician Pricilla Loveless, MD  Final diagnoses:  Syncope, unspecified syncope type  Acute pain of left knee    New Prescriptions New Prescriptions   No medications on file     Audry Pili, Cordelia Poche 05/19/17 1647    Pricilla Loveless, MD 05/25/17 [phone]

## 2017-05-19 NOTE — Discharge Instructions (Signed)
Please read and follow all provided instructions.  Your diagnoses today include:  1. Syncope, unspecified syncope type   2. Acute pain of left knee     Tests performed today include: Vital signs. See below for your results today.   Medications prescribed:  Take as prescribed   Home care instructions:  Follow any educational materials contained in this packet.  Follow-up instructions: Please follow-up with your primary care provider for further evaluation of symptoms and treatment   Return instructions:  Please return to the Emergency Department if you do not get better, if you get worse, or new symptoms OR  - Fever (temperature greater than 101.65F)  - Bleeding that does not stop with holding pressure to the area    -Severe pain (please note that you may be more sore the day after your accident)  - Chest Pain  - Difficulty breathing  - Severe nausea or vomiting  - Inability to tolerate food and liquids  - Passing out  - Skin becoming red around your wounds  - Change in mental status (confusion or lethargy)  - New numbness or weakness    Please return if you have any other emergent concerns.  Additional Information:  Your vital signs today were: BP 118/62    Pulse (!) 59    Temp 98.4 F (36.9 C) (Oral)    Resp 14    Ht 5\' 5"  (1.651 m)    Wt 69.4 kg (153 lb)    LMP 05/09/2017 (Approximate)    SpO2 100%    BMI 25.46 kg/m  If your blood pressure (BP) was elevated above 135/85 this visit, please have this repeated by your doctor within one month. ---------------

## 2018-12-07 ENCOUNTER — Emergency Department (HOSPITAL_BASED_OUTPATIENT_CLINIC_OR_DEPARTMENT_OTHER)
Admission: EM | Admit: 2018-12-07 | Discharge: 2018-12-07 | Disposition: A | Discharge status: Home / Self Care | Payer: Self-pay | Attending: Emergency Medicine | Admitting: Emergency Medicine

## 2018-12-07 ENCOUNTER — Other Ambulatory Visit: Payer: Self-pay

## 2018-12-07 ENCOUNTER — Encounter (HOSPITAL_BASED_OUTPATIENT_CLINIC_OR_DEPARTMENT_OTHER): Payer: Self-pay | Admitting: Emergency Medicine

## 2018-12-07 DIAGNOSIS — K0889 Other specified disorders of teeth and supporting structures: Secondary | ICD-10-CM | POA: Insufficient documentation

## 2018-12-07 DIAGNOSIS — F17210 Nicotine dependence, cigarettes, uncomplicated: Secondary | ICD-10-CM | POA: Insufficient documentation

## 2018-12-07 MED ORDER — PENICILLIN V POTASSIUM 500 MG PO TABS: 500.0000 mg | ORAL_TABLET | Freq: Four times a day (QID) | ORAL | 0 refills | Status: AC

## 2018-12-07 MED ORDER — BENZOCAINE 10 % MT GEL: 1.0000 "application " | OROMUCOSAL | 0 refills | Status: DC | PRN

## 2018-12-07 NOTE — Discharge Instructions (Signed)
Please take entire course of antibiotics as directed.  Continue using ibuprofen and Tylenol, as well as prescribed Orajel for pain.  You will need to follow-up with your dentist for continued management of this.  Return to the emergency department for fevers, swelling or pain under the tongue or in the neck, difficulty breathing or swallowing or any other new or concerning symptoms. 

## 2018-12-07 NOTE — ED Triage Notes (Signed)
Pt c/o right lower toothache for "a while."

## 2018-12-07 NOTE — ED Provider Notes (Signed)
[facility] EMERGENCY DEPARTMENT Provider Note   CSN: [id number] Arrival date & time: 12/07/18  1357     History   Chief Complaint Chief Complaint  Patient presents with  . Dental Pain    HPI Nicole Coleman is a 33 y.o. female.  Nicole Coleman is a 33 y.o. female With a history of anemia, who presents to the emergency department for evaluation of dental pain.  She reports right lower posterior molar broke off in July and she initially saw her dentist regarding this and they developed a treatment plan but because her dentist was out of network she was unable to afford this care.  She reports she has had intermittent issues with infections and pain since then.  Has not been on any recent antibiotics and reports for the last several days she has had worsening pain surrounding this tooth.  It is painful to chew but she does not have any trouble swallowing or breathing.  Able to tolerate secretions without difficulty.  No pain or swelling under the tongue.  No neck pain or stiffness.  She has not had any fevers or chills, no nausea or vomiting.  Has been taking Tylenol and ibuprofen without improvement in pain has not attempted to follow-up with another dentist.     Past Medical History:  Diagnosis Date  . Anemia     There are no active problems to display for this patient.   History reviewed. No pertinent surgical history.   OB History    Gravida  1   Para      Term      Preterm      AB      Living        SAB      TAB      Ectopic      Multiple      Live Births               Home Medications    Prior to Admission medications   Medication Sig Start Date End Date Taking? Authorizing Provider  acetaminophen (TYLENOL) 325 MG tablet Take 650 mg by mouth every 6 (six) hours as needed.    [provider]  ibuprofen (ADVIL,MOTRIN) 600 MG tablet Take 1 tablet (600 mg total) by mouth every 6 (six) hours as needed. 05/19/17   Audry Pili, PA-C    lidocaine (XYLOCAINE) 2 % solution Apply topically to affected tooth as needed for pain. 11/27/16   Garlon Hatchet, PA-C  naproxen (NAPROSYN) 500 MG tablet Take 1 tablet (500 mg total) by mouth 2 (two) times daily with a meal. 11/27/16   Garlon Hatchet, PA-C  penicillin v potassium (VEETID) 500 MG tablet Take 1 tablet (500 mg total) by mouth 4 (four) times daily. 11/27/16   Garlon Hatchet, PA-C    Family History No family history on file.  Social History Social History   Tobacco Use  . Smoking status: Current Every Day Smoker    Packs/day: 0.50  . Smokeless tobacco: Never Used  Substance Use Topics  . Alcohol use: Yes    Comment: occasional  . Drug use: No     Allergies   Patient has no known allergies.   Review of Systems Review of Systems  Constitutional: Negative for chills and fever.  HENT: Positive for dental problem. Negative for facial swelling, sore throat and trouble swallowing.   Gastrointestinal: Negative for nausea and vomiting.  Skin: Negative for color change  and rash.     Physical Exam Updated Vital Signs BP 136/71 (BP Location: Left Arm)   Pulse 90   Temp 98.4 F (36.9 C) (Oral)   Resp 16   Ht 5\' 6"  (1.676 m)   Wt 77.1 kg   LMP 11/29/2018   SpO2 100%   Breastfeeding No   BMI 27.44 kg/m   Physical Exam Vitals signs and nursing note reviewed.  Constitutional:      General: She is not in acute distress.    Appearance: She is well-developed. She is not diaphoretic.  HENT:     Head: Normocephalic and atraumatic.     Mouth/Throat:     Comments: Posterior oropharynx clear and moist, no trismus, normal phonation, multiple teeth and poor dentition and right posterior molar is broken off at the gumline, there is surrounding erythema of the gums but no obvious abscess or drainage, there is no sublingual tenderness or swelling. Eyes:     General:        Right eye: No discharge.        Left eye: No discharge.  Neck:     Musculoskeletal: Neck supple.      Comments: No torticollis Pulmonary:     Effort: Pulmonary effort is normal. No respiratory distress.  Skin:    General: Skin is warm and dry.  Neurological:     Mental Status: She is alert.     Coordination: Coordination normal.  Psychiatric:        Mood and Affect: Mood normal.        Behavior: Behavior normal.      ED Treatments / Results  Labs (all labs ordered are listed, but only abnormal results are displayed) Labs Reviewed - No data to display  EKG None  Radiology No results found.  Procedures Procedures (including critical care time)  Medications Ordered in ED Medications - No data to display   Initial Impression / Assessment and Plan / ED Course  I have reviewed the triage vital signs and the nursing notes.  Pertinent labs & imaging results that were available during my care of the patient were reviewed by me and considered in my medical decision making (see chart for details).  Patient with toothache.  No gross abscess.  Exam unconcerning for Ludwig's angina or spread of infection.  Will treat with penicillin and anti-inflammatories medicine.  Urged patient to follow-up with dentist.  Dental resources provided.  Final Clinical Impressions(s) / ED Diagnoses   Final diagnoses:  Pain, dental    ED Discharge Orders         Ordered    penicillin v potassium (VEETID) 500 MG tablet  4 times daily     12/07/18 1503    benzocaine (ORAJEL) 10 % mucosal gel  As needed     12/07/18 1503           Dartha LodgeFord, Demontez Novack N, [place]PA-C 12/07/18 1510    Charlynne PanderYao, David Hsienta, MD 12/07/18 1524

## 2018-12-31 IMAGING — CT CT HEAD W/O CM
3 series · 15 of 47 positions shown, 18 images · non-contrast
Comparison: None

CLINICAL DATA: Lightheaded, syncopal episodes twice today in close
succession witnessed by a friend, history of smoking

EXAM:
CT HEAD WITHOUT CONTRAST
TECHNIQUE: Contiguous axial images were obtained from the base of the skull
through the vertex without intravenous contrast. Sagittal and
coronal MPR images reconstructed from axial data set.

[Series 2: head wo · axial · 0.42mm/px · z∈[-171,-46]mm · 9 of 30 slices shown, 12 images]
[im 3/30  brain]
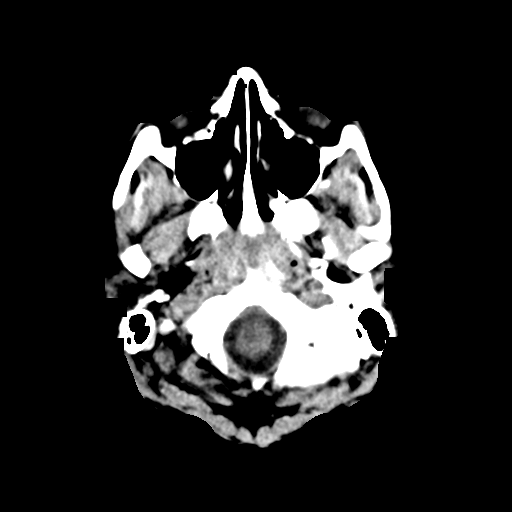
[im 3/30  bone]
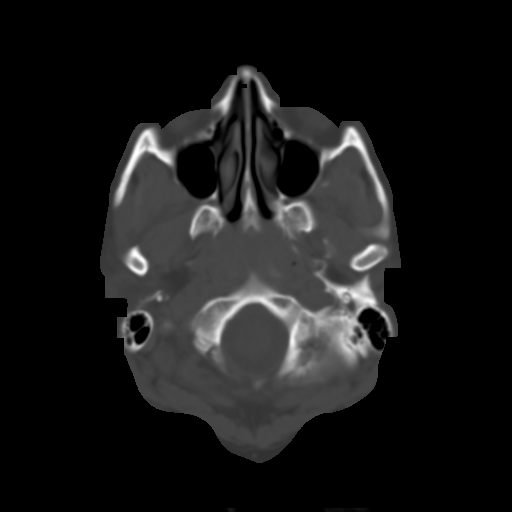
[im 6/30  brain]
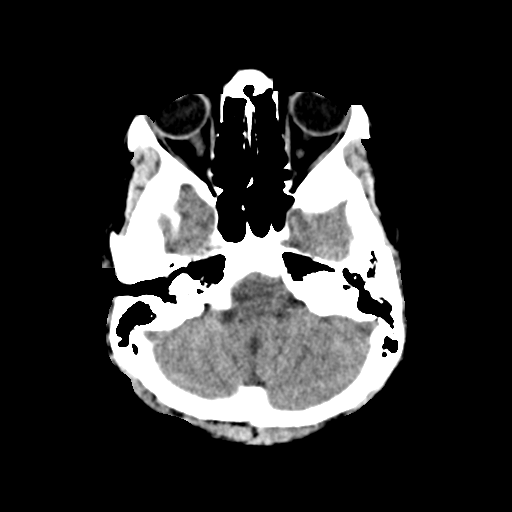
[im 9/30  brain]
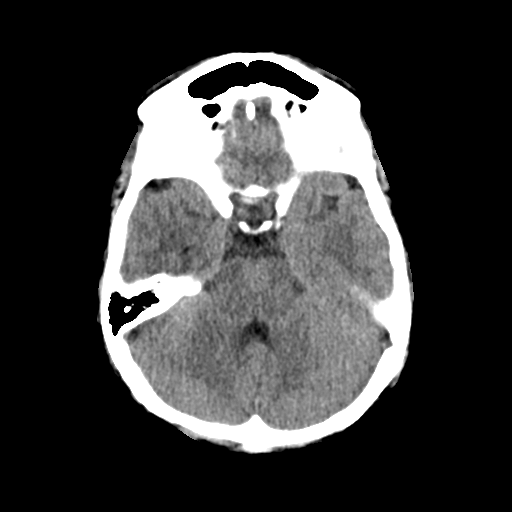
[im 12/30  brain]
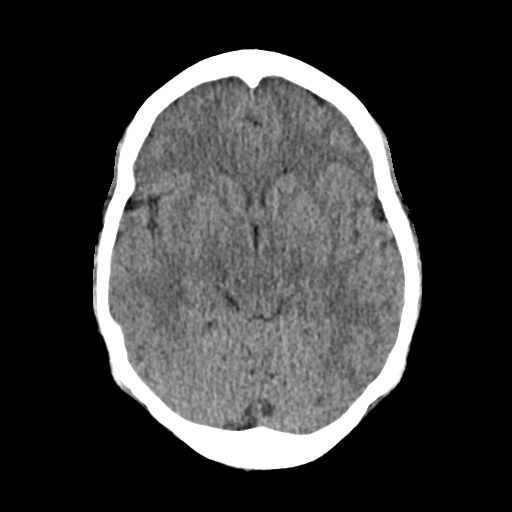
[im 16/30  brain]
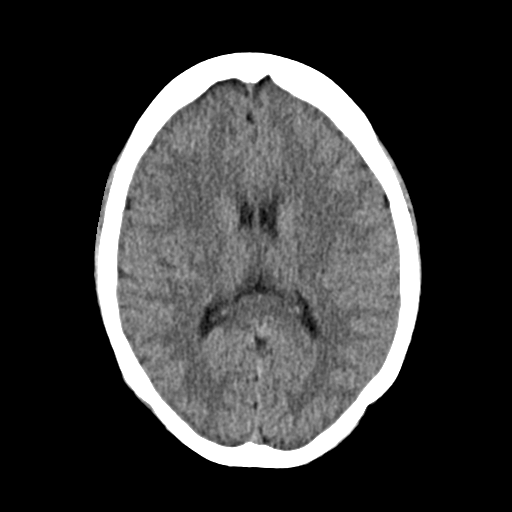
[im 16/30  bone]
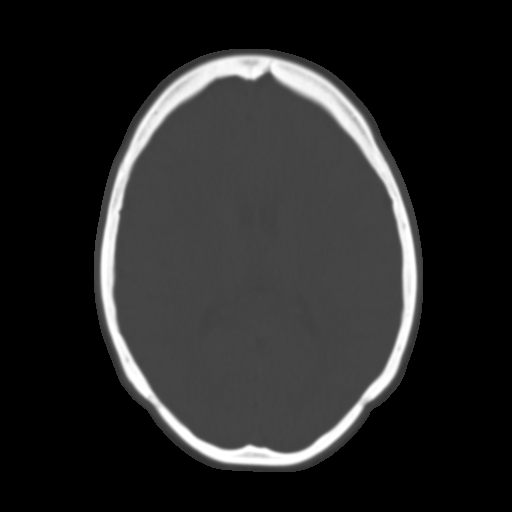
[im 19/30  brain]
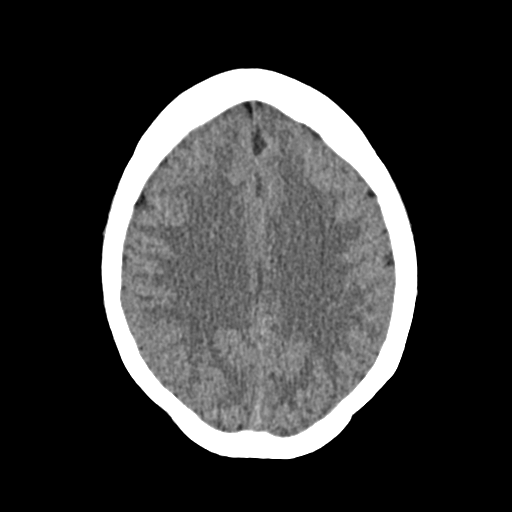
[im 22/30  brain]
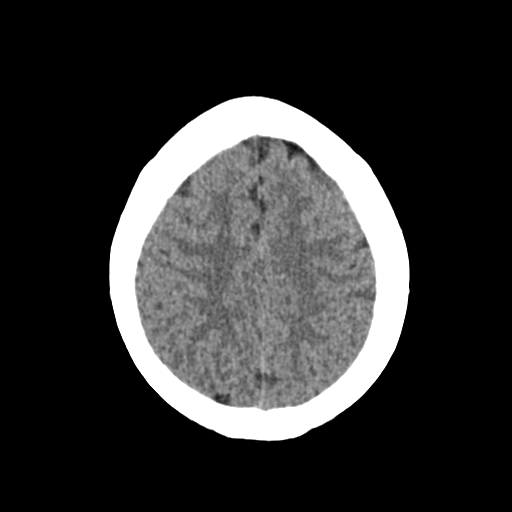
[im 25/30  brain]
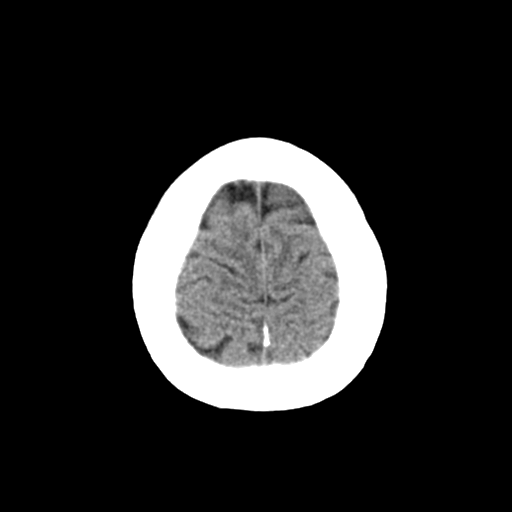
[im 28/30  brain]
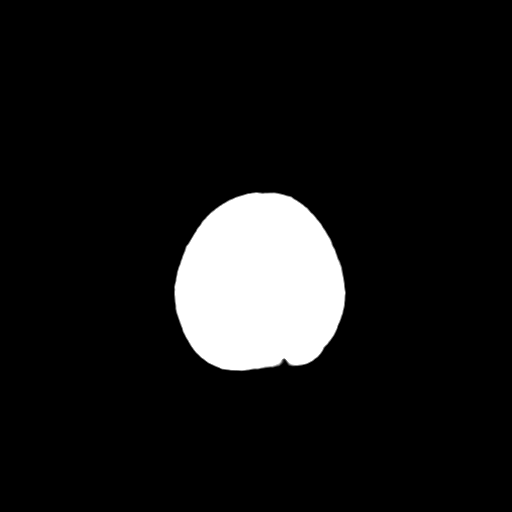
[im 28/30  bone]
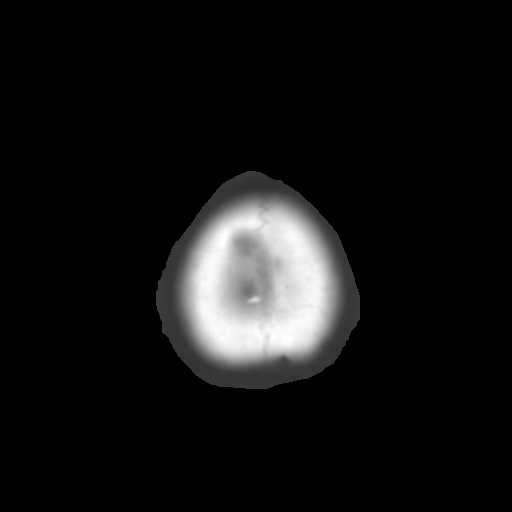

[Series 4: coronal soft · coronal · 0.31mm/px · 3 of 61 slices shown]
[im 21/61  brain]
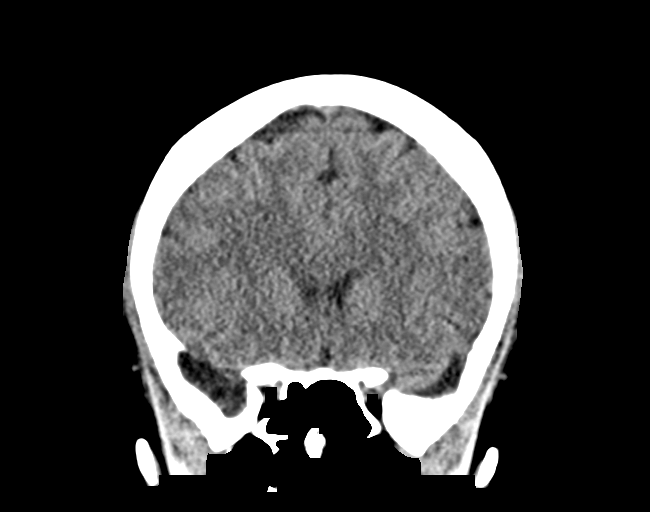
[im 27/61  brain]
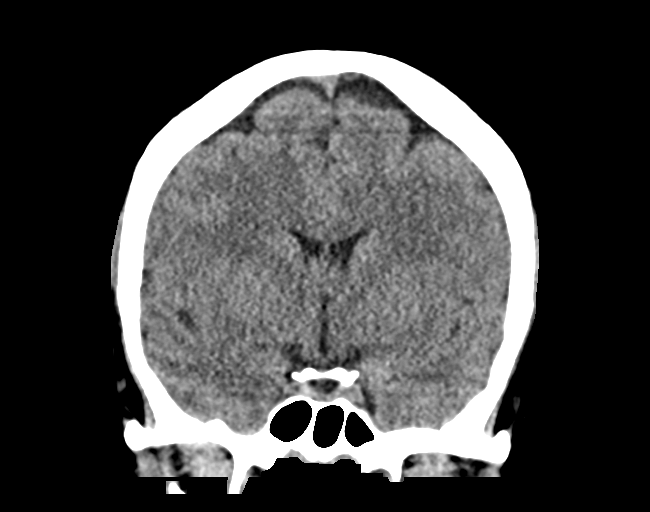
[im 34/61  brain]
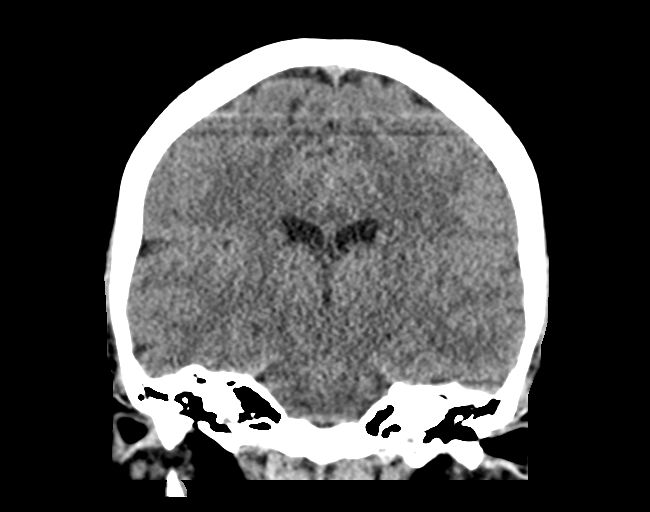

[Series 5: sag soft · sagittal · 0.31mm/px · 3 of 51 slices shown]
[im 17/51  brain]
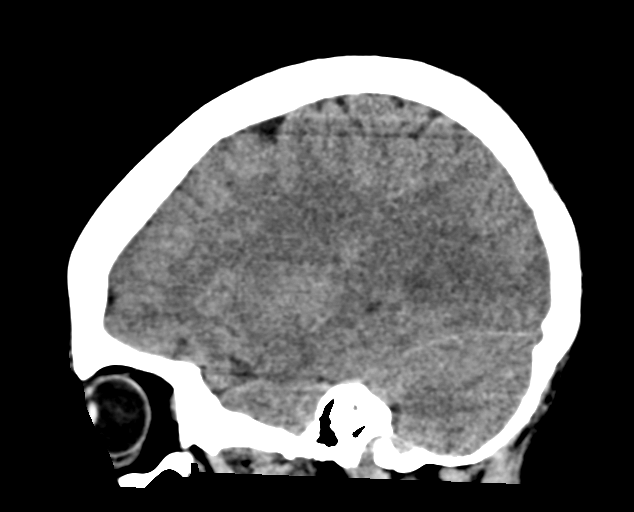
[im 26/51  brain]
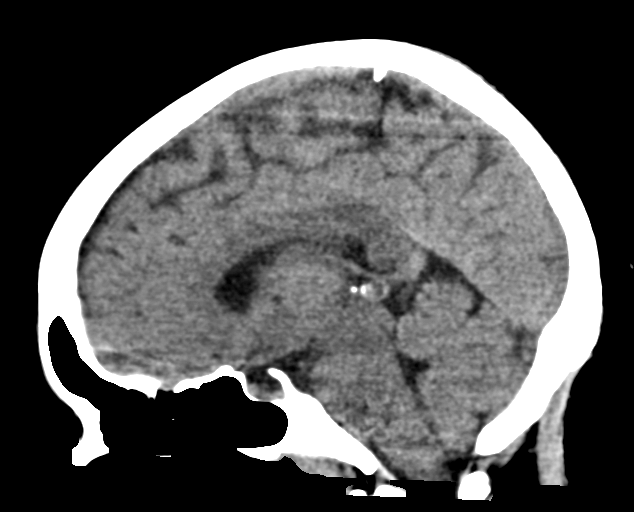
[im 34/51  brain]
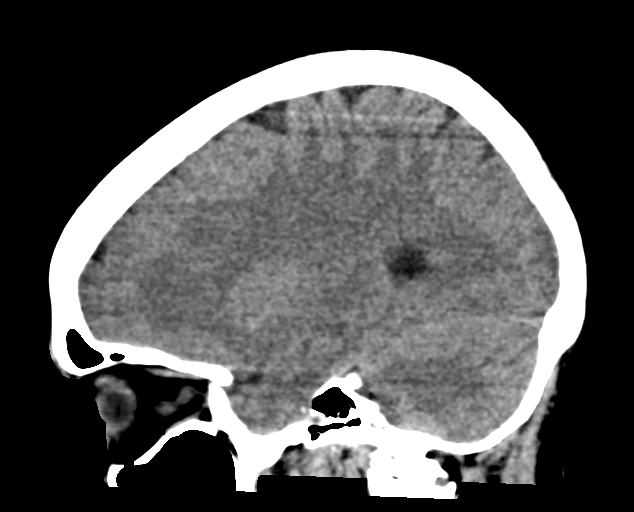

[15 of 47 positions shown; findings below may reference images not displayed]

FINDINGS: Brain: Normal ventricular morphology. No midline shift or mass
effect. Normal appearance of brain parenchyma. No intracranial
hemorrhage, mass lesion or evidence of acute infarction. No
extra-axial fluid collections.

Vascular: Unremarkable

Skull: Intact

Sinuses/Orbits: Clear

Other: N/A
IMPRESSION: Normal exam.

## 2019-12-18 ENCOUNTER — Emergency Department (HOSPITAL_BASED_OUTPATIENT_CLINIC_OR_DEPARTMENT_OTHER)
Admission: EM | Admit: 2019-12-18 | Discharge: 2019-12-18 | Disposition: A | Discharge status: Home / Self Care | Payer: Medicaid Other | Attending: Emergency Medicine | Admitting: Emergency Medicine

## 2019-12-18 ENCOUNTER — Encounter (HOSPITAL_BASED_OUTPATIENT_CLINIC_OR_DEPARTMENT_OTHER): Payer: Self-pay | Admitting: *Deleted

## 2019-12-18 ENCOUNTER — Other Ambulatory Visit: Payer: Self-pay

## 2019-12-18 ENCOUNTER — Emergency Department (HOSPITAL_BASED_OUTPATIENT_CLINIC_OR_DEPARTMENT_OTHER): Payer: Medicaid Other

## 2019-12-18 DIAGNOSIS — F17210 Nicotine dependence, cigarettes, uncomplicated: Secondary | ICD-10-CM | POA: Diagnosis not present

## 2019-12-18 DIAGNOSIS — A599 Trichomoniasis, unspecified: Secondary | ICD-10-CM | POA: Diagnosis not present

## 2019-12-18 DIAGNOSIS — O4691 Antepartum hemorrhage, unspecified, first trimester: Secondary | ICD-10-CM | POA: Insufficient documentation

## 2019-12-18 DIAGNOSIS — R102 Pelvic and perineal pain: Secondary | ICD-10-CM | POA: Diagnosis not present

## 2019-12-18 DIAGNOSIS — O4690 Antepartum hemorrhage, unspecified, unspecified trimester: Secondary | ICD-10-CM

## 2019-12-18 DIAGNOSIS — Z3A01 Less than 8 weeks gestation of pregnancy: Secondary | ICD-10-CM | POA: Diagnosis not present

## 2019-12-18 DIAGNOSIS — Z3491 Encounter for supervision of normal pregnancy, unspecified, first trimester: Secondary | ICD-10-CM

## 2019-12-18 DIAGNOSIS — O209 Hemorrhage in early pregnancy, unspecified: Secondary | ICD-10-CM

## 2019-12-18 DIAGNOSIS — O200 Threatened abortion: Secondary | ICD-10-CM

## 2019-12-18 LAB — BASIC METABOLIC PANEL
Anion gap: 6 (ref 5–15)
BUN: 10 mg/dL (ref 6–20)
CO2: 24 mmol/L (ref 22–32)
Calcium: 9.1 mg/dL (ref 8.9–10.3)
Chloride: 107 mmol/L (ref 98–111)
Creatinine, Ser: 0.43 mg/dL — ABNORMAL LOW (ref 0.44–1.00)
GFR calc Af Amer: >60 mL/min (ref >60)
GFR calc non Af Amer: >60 mL/min (ref >60)
Glucose, Bld: 114 mg/dL — ABNORMAL HIGH (ref 70–99)
Potassium: 3.6 mmol/L (ref 3.5–5.1)
Sodium: 137 mmol/L (ref 135–145)

## 2019-12-18 LAB — WET PREP, GENITAL
Sperm: NONE SEEN
Yeast Wet Prep HPF POC: NONE SEEN

## 2019-12-18 LAB — CBC WITH DIFFERENTIAL/PLATELET
Abs Immature Granulocytes: 0.04 10*3/uL (ref 0.00–0.07)
Basophils Absolute: 0 10*3/uL (ref 0.0–0.1)
Basophils Relative: 1 %
Eosinophils Absolute: 0.1 10*3/uL (ref 0.0–0.5)
Eosinophils Relative: 1 %
HCT: 35.1 % — ABNORMAL LOW (ref 36.0–46.0)
Hemoglobin: 10.1 g/dL — ABNORMAL LOW (ref 12.0–15.0)
Immature Granulocytes: 1 %
Lymphocytes Relative: 35 %
Lymphs Abs: 2.8 10*3/uL (ref 0.7–4.0)
MCH: 22.2 pg — ABNORMAL LOW (ref 26.0–34.0)
MCHC: 28.8 g/dL — ABNORMAL LOW (ref 30.0–36.0)
MCV: 77.3 fL — ABNORMAL LOW (ref 80.0–100.0)
Monocytes Absolute: 0.5 10*3/uL (ref 0.1–1.0)
Monocytes Relative: 6 %
Neutro Abs: 4.5 10*3/uL (ref 1.7–7.7)
Neutrophils Relative %: 56 %
Platelets: 326 10*3/uL (ref 150–400)
RBC: 4.54 MIL/uL (ref 3.87–5.11)
RDW: 18.3 % — ABNORMAL HIGH (ref 11.5–15.5)
WBC: 7.8 10*3/uL (ref 4.0–10.5)
nRBC: 0 % (ref 0.0–0.2)

## 2019-12-18 LAB — URINALYSIS, ROUTINE W REFLEX MICROSCOPIC
Bilirubin Urine: NEGATIVE
Glucose, UA: NEGATIVE mg/dL
Ketones, ur: 15 mg/dL — AB
Leukocytes,Ua: NEGATIVE
Nitrite: NEGATIVE
Protein, ur: NEGATIVE mg/dL
Specific Gravity, Urine: 1.02 (ref 1.005–1.030)
pH: 7.5 (ref 5.0–8.0)

## 2019-12-18 LAB — URINALYSIS, MICROSCOPIC (REFLEX)

## 2019-12-18 LAB — HCG, QUANTITATIVE, PREGNANCY: hCG, Beta Chain, Quant, S: 1233 m[IU]/mL — ABNORMAL HIGH (ref <5)

## 2019-12-18 LAB — PREGNANCY, URINE: Preg Test, Ur: POSITIVE — AB

## 2019-12-18 MED ORDER — METRONIDAZOLE 500 MG PO TABS: 500.0000 mg | ORAL_TABLET | Freq: Two times a day (BID) | ORAL | 0 refills | Status: DC

## 2019-12-18 NOTE — ED Triage Notes (Addendum)
Pt c/o vaginal bleeding and lower abd cramping x 1 day , pt states home preg test pos LMP dec 22

## 2019-12-18 NOTE — Discharge Instructions (Signed)
Please read and follow all provided instructions.  Your diagnoses today include:  1. Vaginal bleeding in pregnancy   2. Vaginal bleeding in pregnancy, first trimester   3. Threatened miscarriage   4. Trichomonas infection   5. First trimester pregnancy     Tests performed today include:  Blood counts and electrolytes  Urine test -no infection  Wet prep -shows trichomonas infection  Ultrasound -shows an early pregnancy inside of the uterus, no fetal activity is seen yet however it may be too early to see this given that you are only 5 weeks and 0 days along  Vital signs. See below for your results today.   Medications prescribed:   Metronidazole - antibiotic  You have been prescribed an antibiotic medicine: take the entire course of medicine even if you are feeling better. Stopping early can cause the antibiotic not to work. Do not drink alcohol when taking this medication.   Take any prescribed medications only as directed.  Home care instructions:  Follow any educational materials contained in this packet.  BE VERY CAREFUL not to take multiple medicines containing Tylenol (also called acetaminophen). Doing so can lead to an overdose which can damage your liver and cause liver failure and possibly death.   Follow-up instructions: You should call your OB/GYN and let them know about your bleeding in early pregnancy.  You will need a repeat ultrasound in the next 2 weeks to confirm viability of the pregnancy and they may also want to trend one of your blood tests.  Please call them as soon as you can to establish a follow-up plan.  Return instructions:   Please return to the Emergency Department if you experience worsening symptoms.   Return with more severe persistent pain, heavier vaginal bleeding, lightheadedness or passing out.  Please return if you have any other emergent concerns.  Additional Information:  Your vital signs today were: BP 137/79   Pulse 77   Temp  98.1 F (36.7 C)   Resp 16   Ht 5\' 4"  (1.626 m)   LMP 11/11/2019   SpO2 100%   BMI 29.18 kg/m  If your blood pressure (BP) was elevated above 135/85 this visit, please have this repeated by your doctor within one month. --------------

## 2019-12-18 NOTE — ED Provider Notes (Signed)
[place] EMERGENCY DEPARTMENT Provider Note   CSN: [id number] Arrival date & time: 12/18/19  1408     History Chief Complaint  Patient presents with  . Vaginal Bleeding    preg    Nicole Coleman is a 34 y.o. female.  Patient presents with vaginal spotting and lower abdominal cramping in setting of newly diagnosed pregnancy.  Patient states that her last menstrual period was approximately December 22.  Patient missed her period and yesterday took 4 pregnancy tests at home all of which were positive.  She has had 2 previous births without any complications.  No C-section or surgeries.  No previous miscarriages.  Patient states that while she was at work today she developed some lower abdominal cramping and noted some pink discharge and pink coloring of her urine which she was concerned was bleeding.  She denies any back pain.  Patient had a bowel movement in the lower abdominal cramping is improved.  No nausea or vomiting.  No chest pain or shortness of breath.         Past Medical History:  Diagnosis Date  . Anemia     There are no problems to display for this patient.   History reviewed. No pertinent surgical history.   OB History    Gravida  1   Para      Term      Preterm      AB      Living        SAB      TAB      Ectopic      Multiple      Live Births              No family history on file.  Social History   Tobacco Use  . Smoking status: Current Every Day Smoker    Packs/day: 0.50  . Smokeless tobacco: Never Used  Substance Use Topics  . Alcohol use: Yes    Comment: occasional  . Drug use: No    Home Medications Prior to Admission medications   Medication Sig Start Date End Date Taking? Authorizing Provider  acetaminophen (TYLENOL) 325 MG tablet Take 650 mg by mouth every 6 (six) hours as needed.    [provider]  benzocaine (ORAJEL) 10 % mucosal gel Use as directed 1 application in the mouth or throat as  needed for mouth pain. 12/07/18   Jacqlyn Larsen, PA-C  ibuprofen (ADVIL,MOTRIN) 600 MG tablet Take 1 tablet (600 mg total) by mouth every 6 (six) hours as needed. 05/19/17   Shary Decamp, PA-C  lidocaine (XYLOCAINE) 2 % solution Apply topically to affected tooth as needed for pain. 11/27/16   Larene Pickett, PA-C  naproxen (NAPROSYN) 500 MG tablet Take 1 tablet (500 mg total) by mouth 2 (two) times daily with a meal. 11/27/16   Larene Pickett, PA-C    Allergies    Patient has no known allergies.  Review of Systems   Review of Systems  Constitutional: Negative for fever.  HENT: Negative for rhinorrhea and sore throat.   Eyes: Negative for redness.  Respiratory: Negative for cough.   Cardiovascular: Negative for chest pain.  Gastrointestinal: Negative for abdominal pain, diarrhea, nausea and vomiting.  Genitourinary: Positive for menstrual problem, pelvic pain and vaginal bleeding. Negative for dysuria.  Musculoskeletal: Negative for myalgias.  Skin: Negative for rash.  Neurological: Negative for headaches.    Physical Exam Updated Vital Signs BP 137/79  Pulse 77   Temp 98.1 F (36.7 C)   Resp 16   Ht 5\' 4"  (1.626 m)   LMP 11/11/2019   SpO2 100%   BMI 29.18 kg/m   Physical Exam Vitals and nursing note reviewed. Exam conducted with a chaperone present.  Constitutional:      Appearance: She is well-developed.  HENT:     Head: Normocephalic and atraumatic.  Eyes:     Conjunctiva/sclera: Conjunctivae normal.  Pulmonary:     Effort: No respiratory distress.  Genitourinary:    Exam position: Lithotomy position.     Labia:        Right: No tenderness.        Left: No tenderness.      Vagina: Bleeding present. No tenderness.     Cervix: Cervical bleeding present.     Uterus: Enlarged. Not tender.      Adnexa:        Right: No mass or tenderness.         Left: No mass or tenderness.       Comments: Patient with bleeding noted coming through cervical os.  Slightly enlarged  uterus, no tenderness.  Multi parous cervix. Musculoskeletal:     Cervical back: Normal range of motion and neck supple.  Skin:    General: Skin is warm and dry.  Neurological:     Mental Status: She is alert.     ED Results / Procedures / Treatments   Labs (all labs ordered are listed, but only abnormal results are displayed) Labs Reviewed  WET PREP, GENITAL - Abnormal; Notable for the following components:      Result Value   Trich, Wet Prep PRESENT (*)    Clue Cells Wet Prep HPF POC PRESENT (*)    WBC, Wet Prep HPF POC MODERATE (*)    All other components within normal limits  URINALYSIS, ROUTINE W REFLEX MICROSCOPIC - Abnormal; Notable for the following components:   Hgb urine dipstick TRACE (*)    Ketones, ur 15 (*)    All other components within normal limits  PREGNANCY, URINE - Abnormal; Notable for the following components:   Preg Test, Ur POSITIVE (*)    All other components within normal limits  CBC WITH DIFFERENTIAL/PLATELET - Abnormal; Notable for the following components:   Hemoglobin 10.1 (*)    HCT 35.1 (*)    MCV 77.3 (*)    MCH 22.2 (*)    MCHC 28.8 (*)    RDW 18.3 (*)    All other components within normal limits  BASIC METABOLIC PANEL - Abnormal; Notable for the following components:   Glucose, Bld 114 (*)    Creatinine, Ser 0.43 (*)    All other components within normal limits  HCG, QUANTITATIVE, PREGNANCY - Abnormal; Notable for the following components:   hCG, Beta Chain, Quant, S 1,233 (*)    All other components within normal limits  URINALYSIS, MICROSCOPIC (REFLEX) - Abnormal; Notable for the following components:   Bacteria, UA FEW (*)    All other components within normal limits  GC/CHLAMYDIA PROBE AMP ([place]) NOT AT [facility]    EKG None  Radiology [facility] OB LESS THAN 14 WEEKS WITH OB TRANSVAGINAL  Result Date: 12/18/2019 CLINICAL DATA:  First trimester bleeding EXAM: OBSTETRIC <14 WK 12/20/2019 AND TRANSVAGINAL OB US TECHNIQUE: Both  transabdominal and transvaginal ultrasound examinations were performed for complete evaluation of the gestation as well as the maternal uterus, adnexal regions, and pelvic cul-de-sac. Transvaginal technique  was performed to assess early pregnancy. COMPARISON:  None. FINDINGS: Intrauterine gestational sac: Present Yolk sac:  Absent Embryo:  Absent MSD: 3.1 mm   5 w   0 d Subchorionic hemorrhage:  None visualized. Maternal uterus/adnexae: 2.8 cm fibroid is noted within the uterus. This causes distortion of the endometrial canal. Trace free fluid is noted. IMPRESSION: Probable early intrauterine gestational sac, but no yolk sac, fetal pole, or cardiac activity yet visualized. Recommend follow-up quantitative B-HCG levels and follow-up US in 14 days to assess viability. This recommendation follows SRU consensus guidelines: Diagnostic Criteria for Nonviable Pregnancy Early in the First Trimester. Malva Limes Med 2013; 149:7026-37. Uterine fibroid posteriorly with distortion of the endometrial canal. Electronically Signed   By: Alcide Clever M.D.   On: 12/18/2019 15:55    Procedures Procedures (including critical care time)  Medications Ordered in ED Medications - No data to display  ED Course  I have reviewed the triage vital signs and the nursing notes.  Pertinent labs & imaging results that were available during my care of the patient were reviewed by me and considered in my medical decision making (see chart for details).  Patient seen and examined.  Pregnancy confirmed.  Pelvic exam performed with RN chaperone.  Vital signs reviewed and are as follows: BP 137/79   Pulse 77   Temp 98.1 F (36.7 C)   Resp 16   Ht 5\' 4"  (1.626 m)   LMP 11/11/2019   SpO2 100%   BMI 29.18 kg/m   4:17 PM ultrasound results reviewed.  IUP confirmed.  Likely too early to determine cardiac activity as patient is 5 weeks and 0 days long.  Incidental trichomoniasis noted.  Patient be treated with metronidazole twice  daily x1 week.  Patient updated on results.  Gonorrhea/chlamydia testing pending.  Patient will need to be notified if she has positive for either of these.  Strongly encouraged to follow-up with OB/GYN.  We discussed need for repeat ultrasound and trending of beta hCG.  We discussed signs and symptoms to return including heavier bleeding, worsening or persistent abdominal or back pain, lightheadedness or syncope.  Patient verbalizes understanding agrees with plan.      MDM Rules/Calculators/A&P                       Patient with first trimester bleeding.  Ultrasound demonstrates intrauterine pregnancy, early.  Doubt ectopic.  Incidental trichomonas.  Bleeding is mild and patient is not symptomatic.  Blood type B+.  Feel comfortable with discharged home at this time.  Encouraged OB/GYN follow-up for reasons as above.  Patient seems reliable to return with worsening.   Final Clinical Impression(s) / ED Diagnoses Final diagnoses:  Vaginal bleeding in pregnancy  Threatened miscarriage  Trichomonas infection  First trimester pregnancy     Rx / DC Orders ED Discharge Orders         Ordered    metroNIDAZOLE (FLAGYL) 500 MG tablet  2 times daily     12/18/19 1614           12/20/19, PA-C 12/18/19 1620    12/20/19, MD 12/18/19 1946

## 2019-12-19 LAB — GC/CHLAMYDIA PROBE AMP (~~LOC~~) NOT AT ARMC
Chlamydia: NEGATIVE
Neisseria Gonorrhea: NEGATIVE

## 2020-03-12 ENCOUNTER — Inpatient Hospital Stay (HOSPITAL_COMMUNITY)
Admission: AD | Admit: 2020-03-12 | Discharge: 2020-03-12 | Disposition: A | Discharge status: Home / Self Care | Payer: Medicaid Other | Attending: Family Medicine | Admitting: Family Medicine

## 2020-03-12 ENCOUNTER — Inpatient Hospital Stay (HOSPITAL_COMMUNITY): Payer: Medicaid Other

## 2020-03-12 ENCOUNTER — Encounter (HOSPITAL_COMMUNITY): Payer: Self-pay | Admitting: Family Medicine

## 2020-03-12 ENCOUNTER — Other Ambulatory Visit: Payer: Self-pay

## 2020-03-12 DIAGNOSIS — O26891 Other specified pregnancy related conditions, first trimester: Secondary | ICD-10-CM

## 2020-03-12 DIAGNOSIS — R1032 Left lower quadrant pain: Secondary | ICD-10-CM | POA: Diagnosis present

## 2020-03-12 DIAGNOSIS — O99331 Smoking (tobacco) complicating pregnancy, first trimester: Secondary | ICD-10-CM | POA: Insufficient documentation

## 2020-03-12 DIAGNOSIS — Z3A01 Less than 8 weeks gestation of pregnancy: Secondary | ICD-10-CM | POA: Insufficient documentation

## 2020-03-12 DIAGNOSIS — O208 Other hemorrhage in early pregnancy: Secondary | ICD-10-CM | POA: Diagnosis not present

## 2020-03-12 DIAGNOSIS — O418X10 Other specified disorders of amniotic fluid and membranes, first trimester, not applicable or unspecified: Secondary | ICD-10-CM

## 2020-03-12 DIAGNOSIS — O468X1 Other antepartum hemorrhage, first trimester: Secondary | ICD-10-CM | POA: Diagnosis not present

## 2020-03-12 DIAGNOSIS — F17210 Nicotine dependence, cigarettes, uncomplicated: Secondary | ICD-10-CM | POA: Diagnosis not present

## 2020-03-12 HISTORY — DX: Other specified health status: Z78.9

## 2020-03-12 LAB — URINALYSIS, ROUTINE W REFLEX MICROSCOPIC
Bacteria, UA: NONE SEEN
Bilirubin Urine: NEGATIVE
Glucose, UA: NEGATIVE mg/dL
Hgb urine dipstick: NEGATIVE
Ketones, ur: 20 mg/dL — AB
Nitrite: NEGATIVE
Protein, ur: NEGATIVE mg/dL
Specific Gravity, Urine: 1.018 (ref 1.005–1.030)
pH: 6 (ref 5.0–8.0)

## 2020-03-12 LAB — CBC
HCT: 41.1 % (ref 36.0–46.0)
Hemoglobin: 12.9 g/dL (ref 12.0–15.0)
MCH: 26.7 pg (ref 26.0–34.0)
MCHC: 31.4 g/dL (ref 30.0–36.0)
MCV: 84.9 fL (ref 80.0–100.0)
Platelets: 232 10*3/uL (ref 150–400)
RBC: 4.84 MIL/uL (ref 3.87–5.11)
RDW: 17.2 % — ABNORMAL HIGH (ref 11.5–15.5)
WBC: 9.7 10*3/uL (ref 4.0–10.5)
nRBC: 0 % (ref 0.0–0.2)

## 2020-03-12 LAB — WET PREP, GENITAL
Clue Cells Wet Prep HPF POC: NONE SEEN
Sperm: NONE SEEN
Yeast Wet Prep HPF POC: NONE SEEN

## 2020-03-12 LAB — HIV ANTIBODY (ROUTINE TESTING W REFLEX): HIV Screen 4th Generation wRfx: NONREACTIVE

## 2020-03-12 LAB — POCT PREGNANCY, URINE: Preg Test, Ur: POSITIVE — AB

## 2020-03-12 LAB — HCG, QUANTITATIVE, PREGNANCY: hCG, Beta Chain, Quant, S: 94408 m[IU]/mL — ABNORMAL HIGH (ref <5)

## 2020-03-12 MED ORDER — PROMETHAZINE HCL 25 MG/ML IJ SOLN
25.0000 mg | Freq: Once | INTRAMUSCULAR | Status: AC
  Administered 2020-03-12: 25 mg via INTRAMUSCULAR
  Filled 2020-03-12: qty 1

## 2020-03-12 MED ORDER — METRONIDAZOLE 500 MG PO TABS
2000.0000 mg | ORAL_TABLET | Freq: Once | ORAL | Status: AC
  Administered 2020-03-12: 2000 mg via ORAL
  Filled 2020-03-12: qty 4

## 2020-03-12 MED ORDER — ACETAMINOPHEN 500 MG PO TABS
1000.0000 mg | ORAL_TABLET | Freq: Once | ORAL | Status: AC
  Administered 2020-03-12: 14:00:00 1000 mg via ORAL
  Filled 2020-03-12: qty 2

## 2020-03-12 MED ORDER — PROMETHAZINE HCL 12.5 MG PO TABS: 12.5000 mg | ORAL_TABLET | Freq: Four times a day (QID) | ORAL | 0 refills | Status: AC | PRN

## 2020-03-12 NOTE — MAU Note (Signed)
Having pain in LLQ, been going for a couple days.  Has been nauseated. Has not had preg confirmed yet. +HPT (a few)

## 2020-03-12 NOTE — MAU Note (Signed)
Pt states she did not get to finish her medication course for Trichomoniasis due to nausea and vomiting.

## 2020-03-12 NOTE — MAU Provider Note (Signed)
History     CSN: [id number]  Arrival date and time: 03/12/20 1125   First Provider Initiated Contact with Patient 03/12/20 1244      Chief Complaint  Patient presents with  . Abdominal Pain  . Nausea  . Possible Pregnancy   Ms.  Nicole Coleman is a 34 y.o. year old [id number] female at [redacted]w[redacted]d weeks gestation who presents to MAU reporting LLQ pain x 2 days nausea and "a few" (+) HPT (not confirmed). She had a SAB in February of 2021; not through [facility] system per patient. She was diagnosed with trichomonas during a hospital visit in January of 2021. She did not complete the course of treatment and is unsure if her partner was treated. She receives care with [facility], but desires to find an OB/GYN that can deliver at [facility].   OB History    Gravida  4   Para  2   Term  2   Preterm      AB  1   Living  2     SAB  1   TAB      Ectopic      Multiple      Live Births  2           Past Medical History:  Diagnosis Date  . Anemia   . Medical history non-contributory     Past Surgical History:  Procedure Laterality Date  . NO PAST SURGERIES      History reviewed. No pertinent family history.  Social History   Tobacco Use  . Smoking status: Current Every Day Smoker    Packs/day: 0.50  . Smokeless tobacco: Never Used  Substance Use Topics  . Alcohol use: Yes    Comment: occasional  . Drug use: No    Allergies: No Known Allergies  No medications prior to admission.    Review of Systems  Constitutional: Negative.   HENT: Negative.   Eyes: Negative.   Respiratory: Negative.   Cardiovascular: Negative.   Gastrointestinal: Positive for nausea.  Endocrine: Negative.   Genitourinary: Positive for pelvic pain. Negative for vaginal bleeding.       (+) HPT  Musculoskeletal: Negative.   Skin: Negative.   Allergic/Immunologic: Negative.   Neurological: Negative.   Hematological: Negative.   Psychiatric/Behavioral: Negative.    Physical Exam    Blood pressure (!) 117/59, pulse 84, temperature 98.4 F (36.9 C), temperature source Oral, resp. rate 16, height 5\' 4"  (1.626 m), weight 70 kg, last menstrual period 01/28/2020, SpO2 100 %.  Physical Exam  Nursing note and vitals reviewed. Constitutional: She is oriented to person, place, and time. She appears well-developed and well-nourished.  HENT:  Head: Normocephalic and atraumatic.  Eyes: Pupils are equal, round, and reactive to light.  Cardiovascular: Normal rate and regular rhythm.  Respiratory: Effort normal.  GI: Soft.  Genitourinary:    Genitourinary Comments: Uterus: non-tender, SE: cervix is smooth, pink, no lesions, moderate amt of thick, yellowish-white vaginal d/c -- WP, GC/CT done, closed/long/firm, no CMT or friability, no adnexal tenderness    Musculoskeletal:        General: Normal range of motion.     Cervical back: Normal range of motion.  Neurological: She is alert and oriented to person, place, and time.  Skin: Skin is warm and dry.  Psychiatric: She has a normal mood and affect. Her behavior is normal. Judgment and thought content normal.    MAU Course  Procedures  MDM [facility]UA UPT  CBC ABO/Rh -- known B Positive HCG Wet Prep GC/CT -- pending HIV -- pending OB < 14 wks [place] with TV Phenergan 25 mg -- resolved nausea, requesting crackers Flagyl 2 grams po   Results for orders placed or performed during the hospital encounter of 03/12/20 (from the past 24 hour(s))  Urinalysis, Routine w reflex microscopic     Status: Abnormal   Collection Time: 03/12/20 12:00 PM  Result Value Ref Range   Color, Urine YELLOW YELLOW   APPearance CLEAR CLEAR   Specific Gravity, Urine 1.018 1.005 - 1.030   pH 6.0 5.0 - 8.0   Glucose, UA NEGATIVE NEGATIVE mg/dL   Hgb urine dipstick NEGATIVE NEGATIVE   Bilirubin Urine NEGATIVE NEGATIVE   Ketones, ur 20 (A) NEGATIVE mg/dL   Protein, ur NEGATIVE NEGATIVE mg/dL   Nitrite NEGATIVE NEGATIVE   Leukocytes,Ua SMALL (A)  NEGATIVE   RBC / HPF 0-5 0 - 5 RBC/hpf   WBC, UA 0-5 0 - 5 WBC/hpf   Bacteria, UA NONE SEEN NONE SEEN   Squamous Epithelial / LPF 0-5 0 - 5   Mucus PRESENT   Wet prep, genital     Status: Abnormal   Collection Time: 03/12/20 12:43 PM   Specimen: PATH Cytology Cervicovaginal Ancillary Only  Result Value Ref Range   Yeast Wet Prep HPF POC NONE SEEN NONE SEEN   Trich, Wet Prep PRESENT (A) NONE SEEN   Clue Cells Wet Prep HPF POC NONE SEEN NONE SEEN   WBC, Wet Prep HPF POC MANY (A) NONE SEEN   Sperm NONE SEEN   Pregnancy, urine POC     Status: Abnormal   Collection Time: 03/12/20 12:50 PM  Result Value Ref Range   Preg Test, Ur POSITIVE (A) NEGATIVE  CBC     Status: Abnormal   Collection Time: 03/12/20  1:12 PM  Result Value Ref Range   WBC 9.7 4.0 - 10.5 K/uL   RBC 4.84 3.87 - 5.11 MIL/uL   Hemoglobin 12.9 12.0 - 15.0 g/dL   HCT 31.4 97.0 - 26.3 %   MCV 84.9 80.0 - 100.0 fL   MCH 26.7 26.0 - 34.0 pg   MCHC 31.4 30.0 - 36.0 g/dL   RDW 78.5 (H) 88.5 - 02.7 %   Platelets 232 150 - 400 K/uL   nRBC 0.0 0.0 - 0.2 %  hCG, quantitative, pregnancy     Status: Abnormal   Collection Time: 03/12/20  1:12 PM  Result Value Ref Range   hCG, Beta Chain, Quant, S 94,408 (H) <5 mIU/mL  HIV Antibody (routine testing w rflx)     Status: None   Collection Time: 03/12/20  1:12 PM  Result Value Ref Range   HIV Screen 4th Generation wRfx NON REACTIVE NON REACTIVE    US OB LESS THAN 14 WEEKS WITH OB TRANSVAGINAL  Result Date: 03/12/2020 CLINICAL DATA:  Abdominal pain. Pregnant patient. Patient is 6 weeks and 2 days pregnant based on her last menstrual period. EXAM: OBSTETRIC <14 WK [place] AND TRANSVAGINAL OB US TECHNIQUE: Both transabdominal and transvaginal ultrasound examinations were performed for complete evaluation of the gestation as well as the maternal uterus, adnexal regions, and pelvic cul-de-sac. Transvaginal technique was performed to assess early pregnancy. COMPARISON:  None. FINDINGS:  Intrauterine gestational sac: Single Yolk sac:  Not Visualized. Embryo:  Visualized. Cardiac Activity: Visualized. Heart Rate: 127 bpm CRL:  9.2 mm   6 w   6 d  [place] EDC: 10/30/2020 Subchorionic hemorrhage:  Small to moderate subchronic hemorrhage. Maternal uterus/adnexae: Heterogeneous fibroid in the mid to lower uterine segment below the gestational sac measuring 2.8 x 2.3 x 2.4 cm. No other uterine masses. Cervix is closed. Normal ovaries and adnexa. No pelvic free fluid. IMPRESSION: 1. Single live intrauterine pregnancy with a measured gestational age of [redacted] weeks and 6 days. 2. Small to moderate subchronic hemorrhage. No other pregnancy complication. 3. 2.8 cm uterine fibroid.  No other abnormalities. Electronically Signed   By: Lajean Manes M.D.   On: 03/12/2020 14:14     Assessment and Plan  Abdominal pain during pregnancy in first trimester  - Information provided on abdominal pain in pregnancy - Advised to take Tylenol 1000 mg prn pain   Subchorionic hematoma in first trimester, single or unspecified fetus  - Information provided on [facility] - Advised that she may have red and/or brown VB or spotting - Return to MAU:  If you have heavier bleeding that soaks through more that 2 pads per hour for an hour or more  If you bleed so much that you feel like you might pass out or you do pass out  If you have significant abdominal pain that is not improved with Tylenol   If you develop a fever > 100.5  - Discharge patient - Keep scheduled appt with [facility] - Information for [facility] given since closer to her residence - Patient verbalized an understanding of the plan of care and agrees.     Laury Deep, MSN, CNM 03/12/2020, 1:03 PM

## 2020-03-15 LAB — GC/CHLAMYDIA PROBE AMP (~~LOC~~) NOT AT ARMC
Chlamydia: NEGATIVE
Comment: NEGATIVE
Comment: NORMAL
Neisseria Gonorrhea: NEGATIVE

## 2020-12-22 ENCOUNTER — Other Ambulatory Visit: Payer: Self-pay | Admitting: Obstetrics and Gynecology

## 2021-07-31 IMAGING — US US OB < 14 WEEKS - US OB TV
1 series · 14 of 28 positions shown · non-contrast
Comparison: None.

CLINICAL DATA: First trimester bleeding

EXAM:
OBSTETRIC <14 WK US AND TRANSVAGINAL OB US
TECHNIQUE: Both transabdominal and transvaginal ultrasound examinations were
performed for complete evaluation of the gestation as well as the
maternal uterus, adnexal regions, and pelvic cul-de-sac.
Transvaginal technique was performed to assess early pregnancy.

[Series 1: us ob < 14 weeks - us ob tv · 33 acquisitions, 14 frames shown]
[im 2/33]
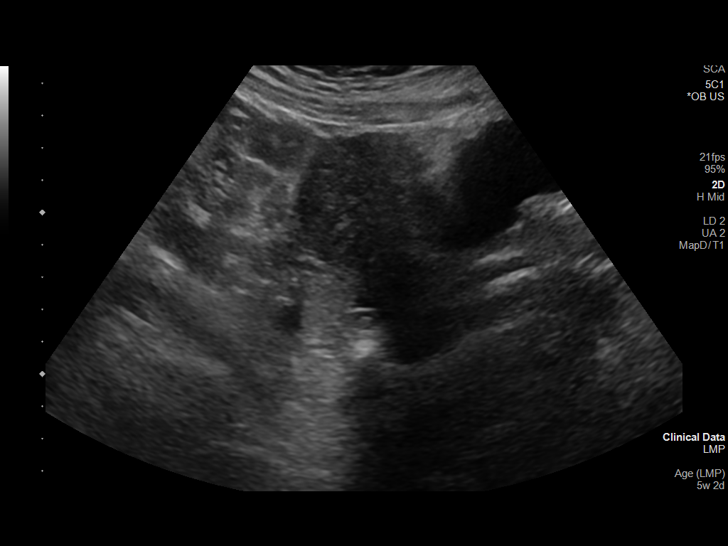
[im 4/33]
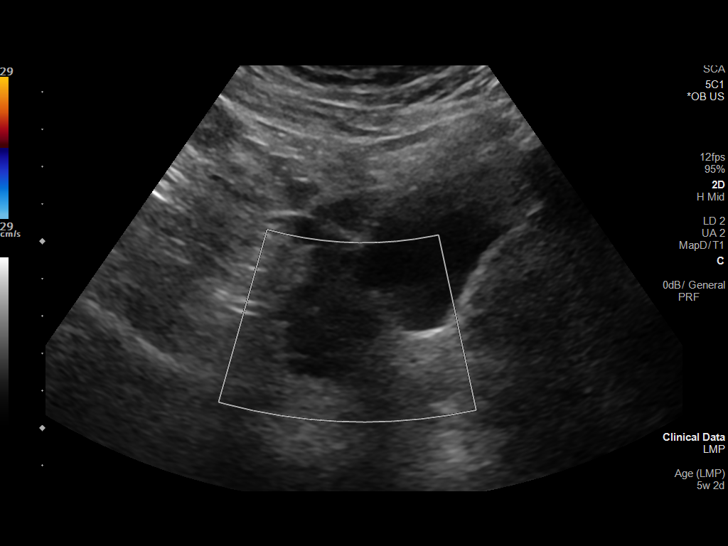
[im 6/33]
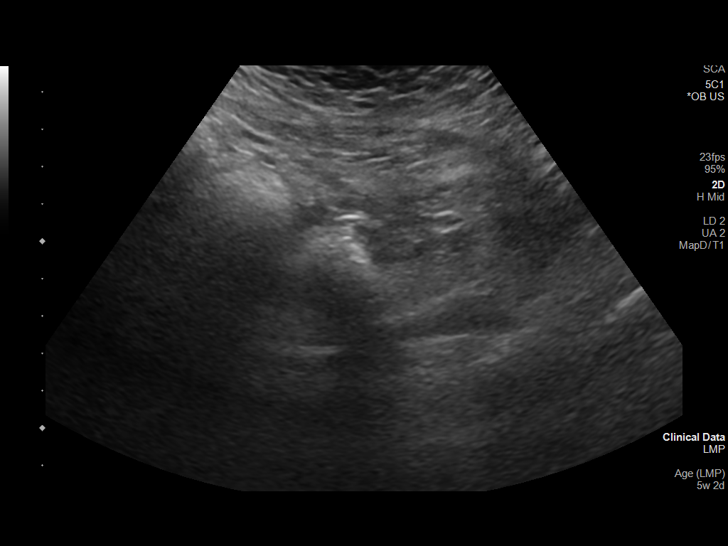
[im 9/33]
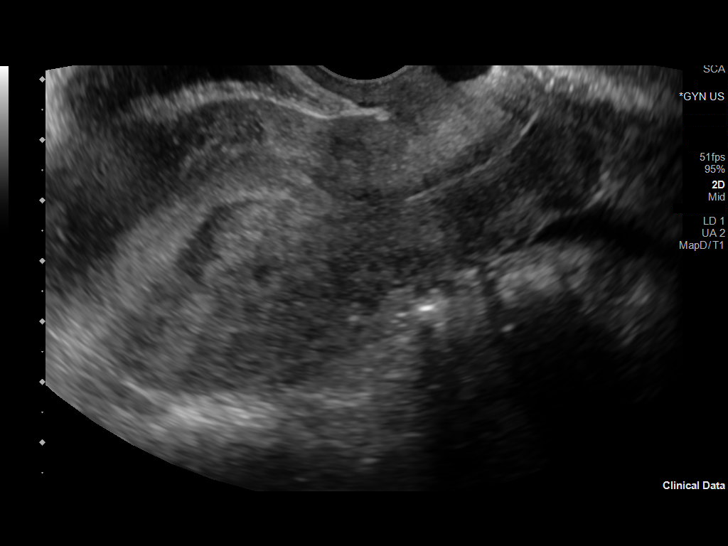
[im 11/33]
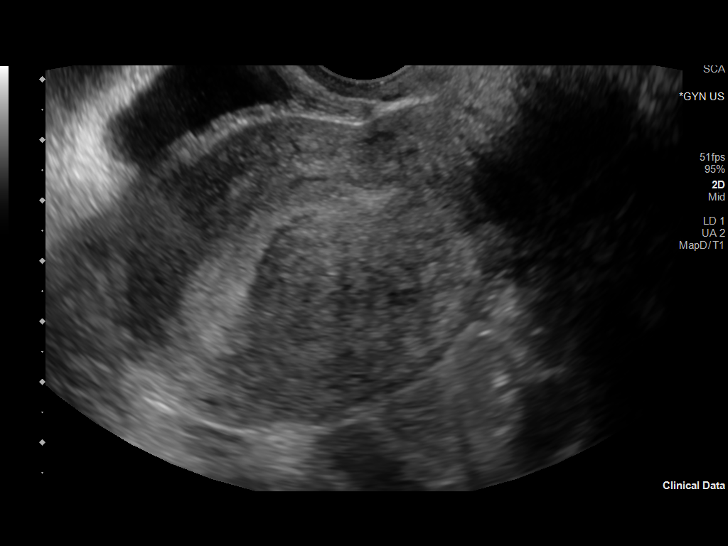
[im 14/33]
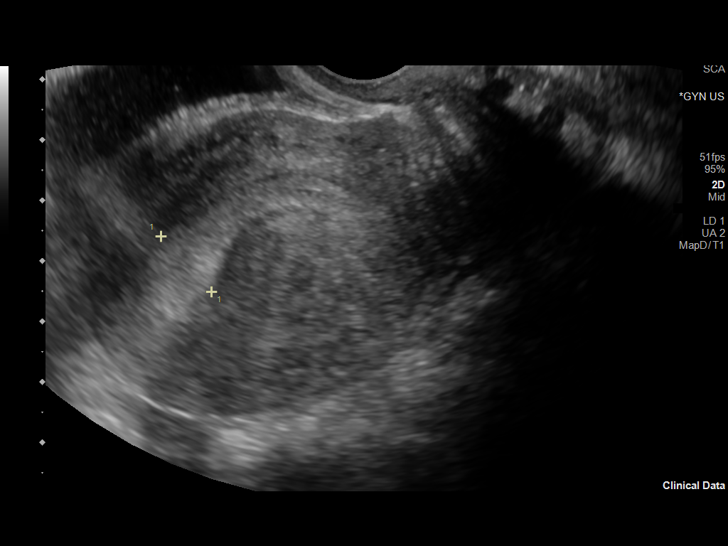
[im 16/33]
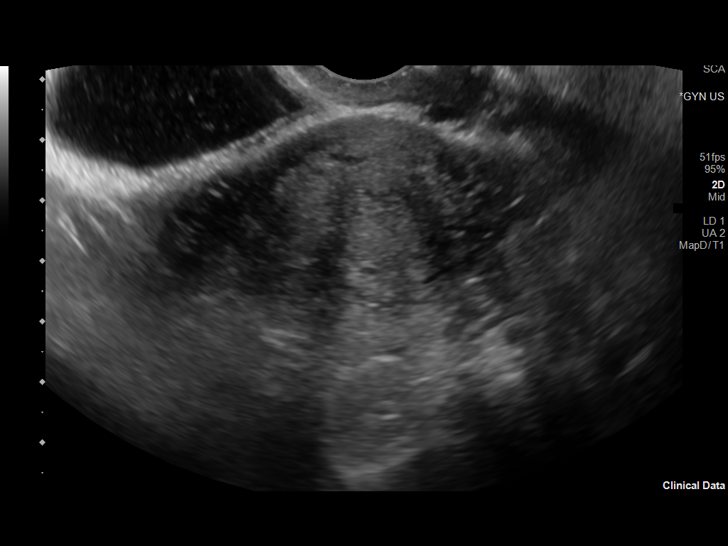
[im 18/33]
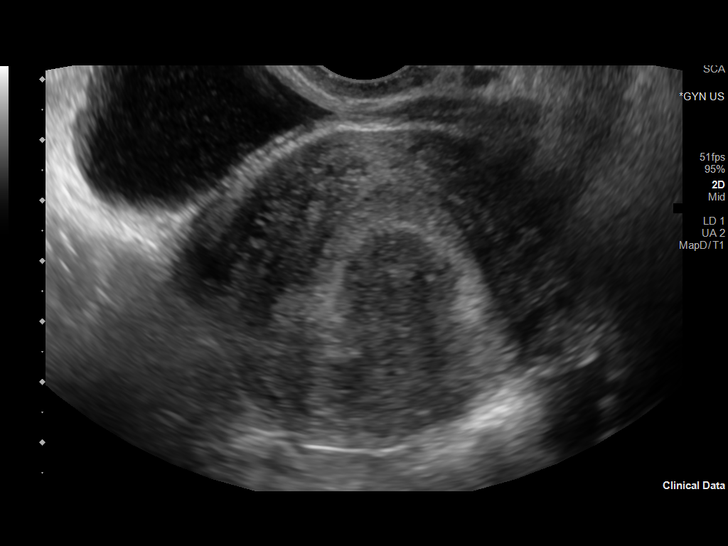
[im 21/33]
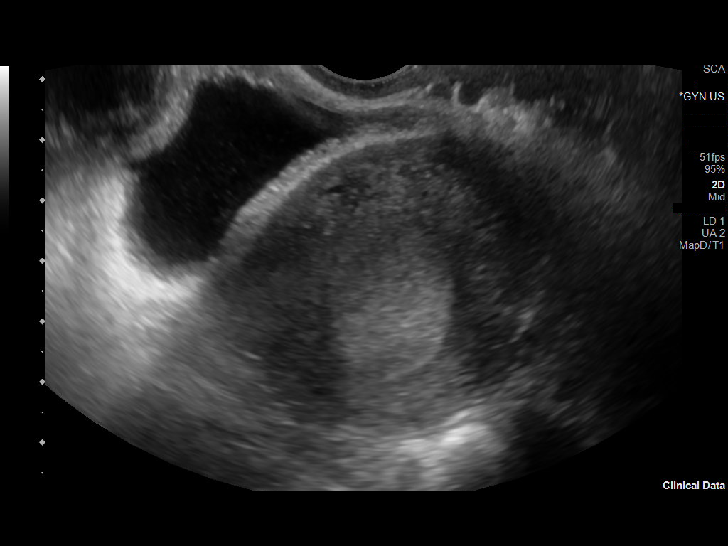
[im 23/33]
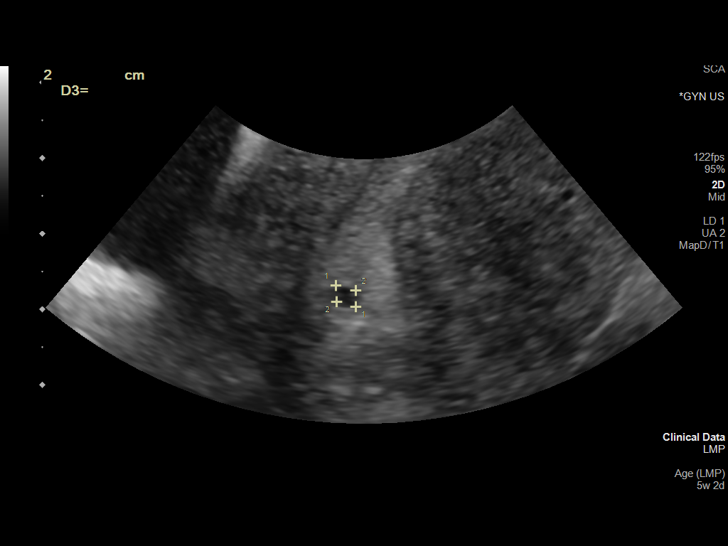
[im 25/33]
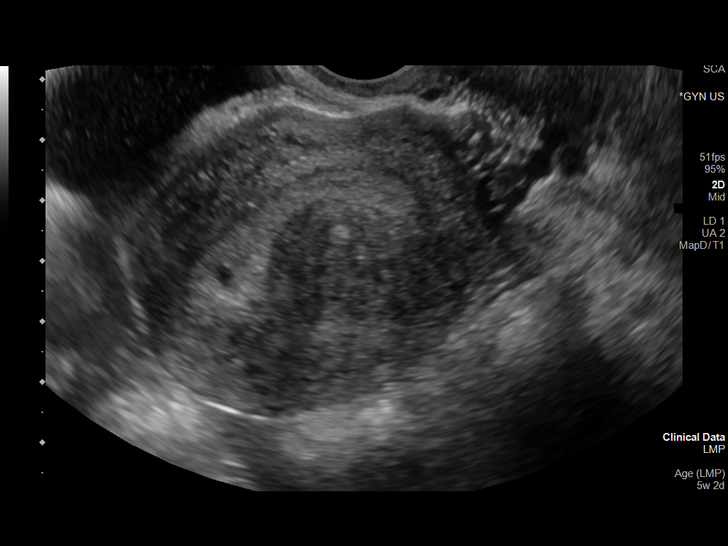
[im 28/33]
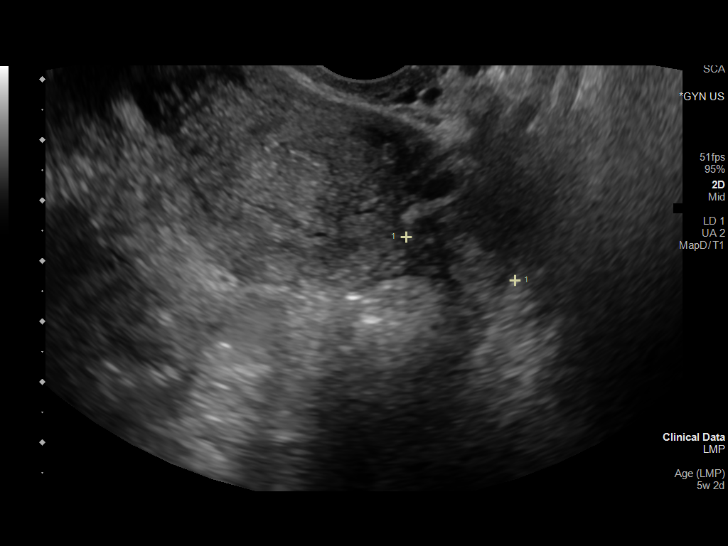
[im 30/33]
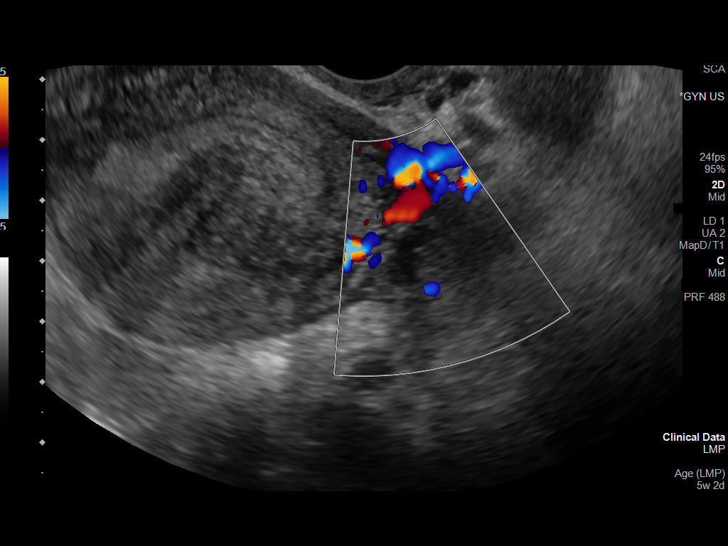
[im 33/33]
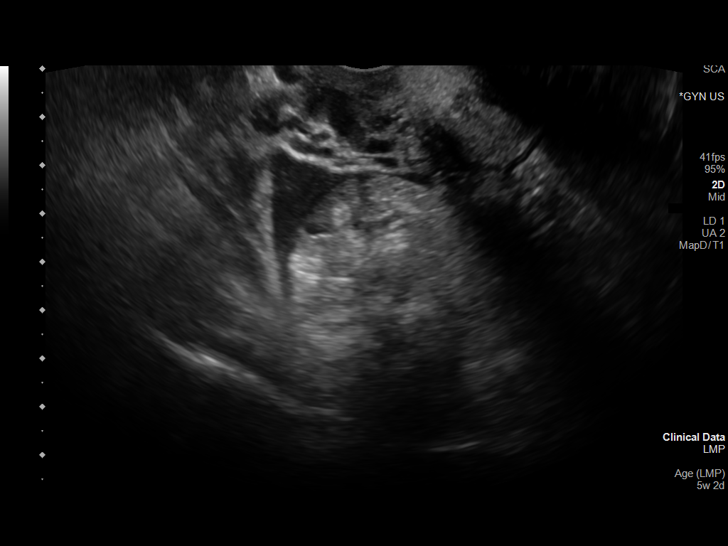

[14 of 28 positions shown; findings below may reference images not displayed]

FINDINGS: Intrauterine gestational sac: Present

Yolk sac:  Absent

Embryo:  Absent

MSD: 3.1 mm   5 w   0 d

Subchorionic hemorrhage:  None visualized.

Maternal uterus/adnexae: 2.8 cm fibroid is noted within the uterus.
This causes distortion of the endometrial canal. Trace free fluid is
noted.
IMPRESSION: Probable early intrauterine gestational sac, but no yolk sac, fetal
pole, or cardiac activity yet visualized. Recommend follow-up
quantitative B-HCG levels and follow-up US in 14 days to assess
viability. This recommendation follows SRU consensus guidelines:
Diagnostic Criteria for Nonviable Pregnancy Early in the First
Trimester. N Engl J Med 6873; [DATE].

Uterine fibroid posteriorly with distortion of the endometrial
canal.

## 2021-11-18 ENCOUNTER — Other Ambulatory Visit: Payer: Self-pay

## 2021-11-18 ENCOUNTER — Emergency Department (HOSPITAL_BASED_OUTPATIENT_CLINIC_OR_DEPARTMENT_OTHER)
Admission: EM | Admit: 2021-11-18 | Discharge: 2021-11-18 | Disposition: A | Discharge status: Home / Self Care | Payer: Medicaid Other | Source: Home / Self Care

## 2023-02-05 ENCOUNTER — Emergency Department (HOSPITAL_BASED_OUTPATIENT_CLINIC_OR_DEPARTMENT_OTHER)
Admission: EM | Admit: 2023-02-05 | Discharge: 2023-02-05 | Disposition: A | Discharge status: Home / Self Care | Payer: Medicaid Other | Attending: Emergency Medicine | Admitting: Emergency Medicine

## 2023-02-05 ENCOUNTER — Other Ambulatory Visit: Payer: Self-pay

## 2023-02-05 ENCOUNTER — Emergency Department (HOSPITAL_BASED_OUTPATIENT_CLINIC_OR_DEPARTMENT_OTHER): Payer: Medicaid Other

## 2023-02-05 ENCOUNTER — Encounter (HOSPITAL_BASED_OUTPATIENT_CLINIC_OR_DEPARTMENT_OTHER): Payer: Self-pay | Admitting: Emergency Medicine

## 2023-02-05 DIAGNOSIS — R231 Pallor: Secondary | ICD-10-CM | POA: Insufficient documentation

## 2023-02-05 DIAGNOSIS — M7989 Other specified soft tissue disorders: Secondary | ICD-10-CM | POA: Diagnosis present

## 2023-02-05 DIAGNOSIS — D649 Anemia, unspecified: Secondary | ICD-10-CM | POA: Insufficient documentation

## 2023-02-05 LAB — CBC WITH DIFFERENTIAL/PLATELET
Abs Immature Granulocytes: 0.03 10*3/uL (ref 0.00–0.07)
Basophils Absolute: 0 10*3/uL (ref 0.0–0.1)
Basophils Relative: 0 %
Eosinophils Absolute: 0.1 10*3/uL (ref 0.0–0.5)
Eosinophils Relative: 1 %
HCT: 28.4 % — ABNORMAL LOW (ref 36.0–46.0)
Hemoglobin: 7.6 g/dL — ABNORMAL LOW (ref 12.0–15.0)
Immature Granulocytes: 0 %
Lymphocytes Relative: 36 %
Lymphs Abs: 3.1 10*3/uL (ref 0.7–4.0)
MCH: 17.8 pg — ABNORMAL LOW (ref 26.0–34.0)
MCHC: 26.8 g/dL — ABNORMAL LOW (ref 30.0–36.0)
MCV: 66.5 fL — ABNORMAL LOW (ref 80.0–100.0)
Monocytes Absolute: 0.6 10*3/uL (ref 0.1–1.0)
Monocytes Relative: 7 %
Neutro Abs: 4.7 10*3/uL (ref 1.7–7.7)
Neutrophils Relative %: 56 %
Platelets: 259 10*3/uL (ref 150–400)
RBC: 4.27 MIL/uL (ref 3.87–5.11)
RDW: 19 % — ABNORMAL HIGH (ref 11.5–15.5)
WBC: 8.5 10*3/uL (ref 4.0–10.5)
nRBC: 0 % (ref 0.0–0.2)

## 2023-02-05 LAB — BASIC METABOLIC PANEL
Anion gap: 5 (ref 5–15)
BUN: 9 mg/dL (ref 6–20)
CO2: 23 mmol/L (ref 22–32)
Calcium: 8.4 mg/dL — ABNORMAL LOW (ref 8.9–10.3)
Chloride: 104 mmol/L (ref 98–111)
Creatinine, Ser: 0.61 mg/dL (ref 0.44–1.00)
GFR, Estimated: >60 mL/min (ref >60)
Glucose, Bld: 126 mg/dL — ABNORMAL HIGH (ref 70–99)
Potassium: 3.6 mmol/L (ref 3.5–5.1)
Sodium: 132 mmol/L — ABNORMAL LOW (ref 135–145)

## 2023-02-05 LAB — URINALYSIS, ROUTINE W REFLEX MICROSCOPIC
Bilirubin Urine: NEGATIVE
Glucose, UA: NEGATIVE mg/dL
Hgb urine dipstick: NEGATIVE
Ketones, ur: NEGATIVE mg/dL
Leukocytes,Ua: NEGATIVE
Nitrite: NEGATIVE
Protein, ur: NEGATIVE mg/dL
Specific Gravity, Urine: 1.02 (ref 1.005–1.030)
pH: 7 (ref 5.0–8.0)

## 2023-02-05 MED ORDER — KETOROLAC TROMETHAMINE 15 MG/ML IJ SOLN
15.0000 mg | Freq: Once | INTRAMUSCULAR | Status: AC
  Administered 2023-02-05: 15 mg via INTRAVENOUS

## 2023-02-05 MED ORDER — ACETAMINOPHEN 500 MG PO TABS
1000.0000 mg | ORAL_TABLET | Freq: Once | ORAL | Status: AC
  Administered 2023-02-05: 1000 mg via ORAL
  Filled 2023-02-05: qty 2

## 2023-02-05 MED ORDER — KETOROLAC TROMETHAMINE 15 MG/ML IJ SOLN
15.0000 mg | Freq: Once | INTRAMUSCULAR | Status: DC
  Filled 2023-02-05: qty 1

## 2023-02-05 NOTE — Discharge Instructions (Addendum)
Thank you for coming to [facility] Emergency Department. You were seen for pain and rash on your legs. 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 You can alternate taking Tylenol and ibuprofen as needed for pain. You can take 650mg  tylenol (acetaminophen) every 4-6 hours, and 600 mg ibuprofen 3 times a day.   Please follow up with your primary care provider within 1 week.If your rash and pain doesn't go away, you may need additional testing.  Do not hesitate to return to the ED or call 911 if you experience: -Worsening symptoms -Numbness in your legs or feet -Lightheadedness, passing out -Fevers/chills -Anything else that concerns you

## 2023-02-05 NOTE — ED Triage Notes (Signed)
States both lower leg swollen more rt than left and hurts to walk x 2 days

## 2023-02-05 NOTE — ED Notes (Signed)
Pt. Not very cooperative with care in the ED.   Pt. Noted with angry attitude and unhappy at time of discharge.  Pt. Was given attention in a timely manner and cared for accordingly.  The Pt. Was very verbal that she felt her time was wasted here and she came here and felt she was not given pain meds as she needed.

## 2023-02-05 NOTE — ED Provider Notes (Signed)
[place] EMERGENCY DE[facility] AT [facility] Provider Note   CSN: [id number] Arrival date & time: 02/05/23  1748     History  Chief Complaint  Patient presents with   Leg Pain    Nicole Coleman is a 37 y.o. female with h/o menorrhagia who presents with LE pain/rash.   States both lower leg swollen more rt than left and hurts to walk x 2 days   BL Leg pain and swelling, R>L, started on Sunday but worsened today. Had trouble walking today d/t pain. No h/o similar. No blood thinner. No h/o DVT/PE. Denies f/c, CP, SOB, abd pain, N/V/D/C, urinary symptoms.  Came back from [place] on Sunday by car, 5 hour drive. Takes OCPs. Also a/w reticular rash on the back of her R leg and on the front of the left one.  *history of autoimmune disease. No other rashes. No CP, SOB, cough, hemoptysis, flank pain, history of recurrent pregnancy loss.    Leg Pain      Home Medications Prior to Admission medications   Medication Sig Start Date End Date Taking? Authorizing Provider  acetaminophen (TYLENOL) 325 MG tablet Take 650 mg by mouth every 6 (six) hours as needed.    [provider]  promethazine (PHENERGAN) 12.5 MG tablet Take 1 tablet (12.5 mg total) by mouth every 6 (six) hours as needed for nausea or vomiting. May insert vaginally, if unable to keep anything down 03/12/20   Laury Deep, CNM      Allergies    Patient has no known allergies.    Review of Systems   Review of Systems Review of systems Negative for f/c.  A 10 point review of systems was performed and is negative unless otherwise reported in HPI.  Physical Exam Updated Vital Signs BP 122/66 (BP Location: Left Arm)   Pulse (!) 102   Temp 98 F (36.7 C)   Resp 18   Ht 5\' 6"  (1.676 m)   Wt 80.7 kg   SpO2 100%   BMI 28.73 kg/m  Physical Exam General: Normal appearing female, lying in bed.  HEENT: PERRLA, Sclera anicteric, MMM, trachea midline. No intraoral lesions. Cardiology: RRR, no  murmurs/rubs/gallops. BL radial and DP pulses equal bilaterally.  Resp: Normal respiratory rate and effort. CTAB, no wheezes, rhonchi, crackles.  Abd: Soft, non-tender, non-distended. No rebound tenderness or guarding.  GU: Deferred. MSK: No peripheral edema or signs of trauma. Extremities without deformity or TTP. No cyanosis or clubbing. Skin: Reticular hyperpigmented rash present on entire posterior R leg as well as small area in anterior left lower leg. +TTP. warm, dry. Neuro: A&Ox4, CNs II-XII grossly intact. MAEs. Sensation grossly intact.  Psych: irritable mood and affect.    LLE     BL LEs     ED Results / Procedures / Treatments   Labs (all labs ordered are listed, but only abnormal results are displayed) Labs Reviewed  BASIC METABOLIC PANEL  CBC WITH DIFFERENTIAL/PLATELET  URINALYSIS, ROUTINE W REFLEX MICROSCOPIC    EKG None  Radiology See ED course  Procedures Procedures    Medications Ordered in ED Medications - No data to display  ED Course/ Medical Decision Making/ A&P                          Medical Decision Making Amount and/or Complexity of Data Reviewed Labs: ordered. Decision-making details documented in ED Course. Radiology:  Decision-making details documented in ED Course.  Risk  OTC drugs. Prescription drug management.    This patient presents to the ED for concern of LE pain and rash;, this involves an extensive number of treatment options, and is a complaint that carries with it a high risk of complications and morbidity.  She is HDS and overall well appearing.  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 Consider some type of vasculitis especially given the fact that her rash is TTP and painful. Also consider  erythema ab igne (hot water bottle rash) though no heat exposure; or autoimmune disease such as sjogrens, lupus, systemic sclerosis. With LE pain, pt on OCPs and recent car trip will r/o DVT with  BL [place] of lower extremities. Intact peripheral pulses, warm feet, no c/f arterial ischemia. Compartments soft. No erythema/induration/crepitus/sores to suggest cellulitis/nec fasc.   Clinical Course as of 02/17/23 1601  Mon Feb 05, 2023  2111 Hemoglobin(!): 7.6 Decreased from 12.9 two years ago [HN]  2153 US Venous Img Lower Bilateral (DVT) No evidence of deep venous thrombosis in either lower extremity. [HN]  2158 Platelets: 259 [HN]    Clinical Course User Index [HN] Audley Hose, MD     Labs: I Ordered, and personally interpreted labs.  The pertinent results include:  those listed above  Imaging Studies ordered: I ordered imaging studies including DVT US I independently visualized and interpreted imaging. I agree with the radiologist interpretation  Additional history obtained from chart review, mother at bedside.    Reevaluation: After the interventions noted above, I reevaluated the patient and found that they have :stayed the same  Social Determinants of Health: Patient lives independently   Disposition:  Pt with neg DVT US. 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 I explained this to patient and relayed that she can f/u with PCP or rheumatologist or dermatologist for further w/u and testing, that this could be an indication of an underlying and possibly chronic disease process, but that sometimes this does resolve on its own. Tylenol/ibuprofen for pain control. Patient requests opiate pain medication, which I declined, and patient became very upset, stating she wasted her time here and that she would just like to be discharged. Then refused to engage in any more conversation with me. I relayed the decrease in her Hgb as well, which is down to 7.6 from several years ago 12, attempted to engage, but patient will not answer further questions about her menstrual cycle w/ h/o menorrhagia. I advised close f/u. Given DC  instructions/return precautions.  Co morbidities that complicate the patient evaluation  Past Medical History:  Diagnosis Date   Anemia    Medical history non-contributory      Medicines No orders of the defined types were placed in this encounter.   I have reviewed the patients home medicines and have made adjustments as needed  Problem List / ED Course: Problem List Items Addressed This Visit   None Visit Diagnoses     Livedo reticularis    -  Primary   Anemia, unspecified type                       This note was created using dictation software, which may contain spelling or grammatical errors.    Audley Hose, MD 02/17/23 [phone]

## 2023-02-05 NOTE — ED Notes (Signed)
Pt to ultrasound

## 2023-02-05 NOTE — ED Notes (Signed)
Pt. Reports she is having pain in the R and the L lower legs and R and L foot with some pain in the upper thigh just above her knees since yesterday.  This pain started yesterday and today was severe with no reports of injury.    Pt. Does report she took a 5 hour trip on Friday and then a 5 hour trip on Sat.  Pt.   Pt. Has no SHOB and no cough noted.
